# Patient Record
Sex: Male | Born: 1945
Health system: Southern US, Community
[De-identification: ages and names within clinical notes are randomized; demographics above are authoritative.]

## PROBLEM LIST (undated history)

## (undated) DIAGNOSIS — H269 Unspecified cataract: Secondary | ICD-10-CM

## (undated) DIAGNOSIS — G4730 Sleep apnea, unspecified: Secondary | ICD-10-CM

## (undated) DIAGNOSIS — I10 Essential (primary) hypertension: Secondary | ICD-10-CM

## (undated) HISTORY — PX: CATARACT EXTRACTION, BILATERAL: SHX1313

## (undated) HISTORY — DX: Essential (primary) hypertension: I10

## (undated) HISTORY — PX: SKIN CANCER EXCISION: SHX779

## (undated) HISTORY — PX: OTHER SURGICAL HISTORY: SHX169

## (undated) HISTORY — DX: Unspecified cataract: H26.9

## (undated) HISTORY — PX: CHOLECYSTECTOMY: SHX55

## (undated) HISTORY — DX: Sleep apnea, unspecified: G47.30

---

## 2002-01-01 ENCOUNTER — Encounter: Admission: RE | Admit: 2002-01-01 | Discharge: 2002-01-01 | Payer: Self-pay | Admitting: Gastroenterology

## 2002-01-01 ENCOUNTER — Encounter: Payer: Self-pay | Admitting: Gastroenterology

## 2003-04-03 ENCOUNTER — Encounter: Payer: Self-pay | Admitting: Pulmonary Disease

## 2003-04-09 ENCOUNTER — Encounter: Payer: Self-pay | Admitting: Pulmonary Disease

## 2003-04-09 ENCOUNTER — Ambulatory Visit (HOSPITAL_BASED_OUTPATIENT_CLINIC_OR_DEPARTMENT_OTHER): Admission: RE | Admit: 2003-04-09 | Discharge: 2003-04-09 | Payer: Self-pay | Admitting: Pulmonary Disease

## 2003-07-15 ENCOUNTER — Encounter: Payer: Self-pay | Admitting: Pulmonary Disease

## 2003-07-15 ENCOUNTER — Ambulatory Visit (HOSPITAL_BASED_OUTPATIENT_CLINIC_OR_DEPARTMENT_OTHER): Admission: RE | Admit: 2003-07-15 | Discharge: 2003-07-15 | Payer: Self-pay | Admitting: Pulmonary Disease

## 2005-04-12 ENCOUNTER — Ambulatory Visit: Payer: Self-pay | Admitting: Family Medicine

## 2005-04-16 ENCOUNTER — Ambulatory Visit: Payer: Self-pay | Admitting: Family Medicine

## 2005-05-28 ENCOUNTER — Ambulatory Visit: Payer: Self-pay | Admitting: Family Medicine

## 2005-07-15 ENCOUNTER — Ambulatory Visit: Payer: Self-pay | Admitting: Family Medicine

## 2006-07-21 ENCOUNTER — Ambulatory Visit: Payer: Self-pay | Admitting: Family Medicine

## 2006-07-22 LAB — CONVERTED CEMR LAB
ALT: 15 units/L (ref 0–40)
AST: 19 units/L (ref 0–37)
Albumin: 4.2 g/dL (ref 3.5–5.2)
Alkaline Phosphatase: 42 units/L (ref 39–117)
BUN: 13 mg/dL (ref 6–23)
Basophils Absolute: 0 10*3/uL (ref 0.0–0.1)
Basophils Relative: 0.6 % (ref 0.0–1.0)
Bilirubin, Direct: 0.1 mg/dL (ref 0.0–0.3)
CO2: 31 meq/L (ref 19–32)
Calcium: 9.5 mg/dL (ref 8.4–10.5)
Chloride: 103 meq/L (ref 96–112)
Cholesterol: 147 mg/dL (ref 0–200)
Creatinine, Ser: 0.9 mg/dL (ref 0.4–1.5)
Direct LDL: 60.8 mg/dL
Eosinophils Absolute: 0.1 10*3/uL (ref 0.0–0.6)
Eosinophils Relative: 2.1 % (ref 0.0–5.0)
GFR calc Af Amer: 111 mL/min
GFR calc non Af Amer: 92 mL/min
Glucose, Bld: 90 mg/dL (ref 70–99)
HCT: 40.3 % (ref 39.0–52.0)
HDL: 24.5 mg/dL — ABNORMAL LOW (ref >39.0)
Hemoglobin: 14.3 g/dL (ref 13.0–17.0)
Lymphocytes Relative: 26.3 % (ref 12.0–46.0)
MCHC: 35.4 g/dL (ref 30.0–36.0)
MCV: 85 fL (ref 78.0–100.0)
Monocytes Absolute: 0.5 10*3/uL (ref 0.2–0.7)
Monocytes Relative: 9.7 % (ref 3.0–11.0)
Neutro Abs: 3.5 10*3/uL (ref 1.4–7.7)
Neutrophils Relative %: 61.3 % (ref 43.0–77.0)
PSA: 1.08 ng/mL (ref 0.10–4.00)
Platelets: 216 10*3/uL (ref 150–400)
Potassium: 4 meq/L (ref 3.5–5.1)
RBC: 4.74 M/uL (ref 4.22–5.81)
RDW: 12 % (ref 11.5–14.6)
Sodium: 141 meq/L (ref 135–145)
TSH: 1.04 microintl units/mL (ref 0.35–5.50)
Total Bilirubin: 0.9 mg/dL (ref 0.3–1.2)
Total CHOL/HDL Ratio: 6
Total Protein: 7.1 g/dL (ref 6.0–8.3)
Triglycerides: 492 mg/dL (ref 0–149)
VLDL: 98 mg/dL — ABNORMAL HIGH (ref 0–40)
WBC: 5.5 10*3/uL (ref 4.5–10.5)

## 2006-09-15 ENCOUNTER — Telehealth: Payer: Self-pay | Admitting: Family Medicine

## 2006-10-25 DIAGNOSIS — I10 Essential (primary) hypertension: Secondary | ICD-10-CM

## 2007-01-23 ENCOUNTER — Encounter: Payer: Self-pay | Admitting: Pulmonary Disease

## 2007-02-16 ENCOUNTER — Ambulatory Visit: Payer: Self-pay | Admitting: Pulmonary Disease

## 2007-02-16 DIAGNOSIS — G4733 Obstructive sleep apnea (adult) (pediatric): Secondary | ICD-10-CM | POA: Insufficient documentation

## 2007-08-23 ENCOUNTER — Telehealth: Payer: Self-pay | Admitting: Family Medicine

## 2007-09-14 ENCOUNTER — Ambulatory Visit: Payer: Self-pay | Admitting: Gastroenterology

## 2007-09-18 ENCOUNTER — Telehealth (INDEPENDENT_AMBULATORY_CARE_PROVIDER_SITE_OTHER): Payer: Self-pay | Admitting: *Deleted

## 2007-09-18 ENCOUNTER — Telehealth: Payer: Self-pay | Admitting: Family Medicine

## 2007-10-10 ENCOUNTER — Encounter: Payer: Self-pay | Admitting: Gastroenterology

## 2007-10-10 ENCOUNTER — Ambulatory Visit: Payer: Self-pay | Admitting: Gastroenterology

## 2007-10-10 LAB — HM COLONOSCOPY

## 2007-10-13 ENCOUNTER — Encounter: Payer: Self-pay | Admitting: Gastroenterology

## 2008-02-15 ENCOUNTER — Ambulatory Visit: Payer: Self-pay | Admitting: Pulmonary Disease

## 2008-04-16 ENCOUNTER — Telehealth: Payer: Self-pay | Admitting: Family Medicine

## 2008-05-16 ENCOUNTER — Ambulatory Visit: Payer: Self-pay | Admitting: Family Medicine

## 2008-05-16 DIAGNOSIS — G5790 Unspecified mononeuropathy of unspecified lower limb: Secondary | ICD-10-CM | POA: Insufficient documentation

## 2008-05-16 LAB — CONVERTED CEMR LAB
ALT: 16 units/L (ref 0–53)
AST: 26 units/L (ref 0–37)
Albumin: 4.4 g/dL (ref 3.5–5.2)
Alkaline Phosphatase: 47 units/L (ref 39–117)
BUN: 16 mg/dL (ref 6–23)
Basophils Absolute: 0 10*3/uL (ref 0.0–0.1)
Basophils Relative: 0.1 % (ref 0.0–3.0)
Bilirubin Urine: NEGATIVE
Bilirubin, Direct: 0.2 mg/dL (ref 0.0–0.3)
Blood Glucose, Fingerstick: 101
Blood in Urine, dipstick: NEGATIVE
CO2: 32 meq/L (ref 19–32)
Calcium: 9.3 mg/dL (ref 8.4–10.5)
Chloride: 103 meq/L (ref 96–112)
Cholesterol: 153 mg/dL (ref 0–200)
Creatinine, Ser: 0.9 mg/dL (ref 0.4–1.5)
Eosinophils Absolute: 0.1 10*3/uL (ref 0.0–0.7)
Eosinophils Relative: 1.8 % (ref 0.0–5.0)
Folate: 10.1 ng/mL
GFR calc non Af Amer: 91.07 mL/min (ref >60)
Glucose, Bld: 93 mg/dL (ref 70–99)
Glucose, Urine, Semiquant: NEGATIVE
HCT: 40.7 % (ref 39.0–52.0)
HDL: 32.7 mg/dL — ABNORMAL LOW (ref >39.00)
Hemoglobin: 14.3 g/dL (ref 13.0–17.0)
Hgb A1c MFr Bld: 5.8 % (ref 4.6–6.5)
Iron: 80 ug/dL (ref 42–165)
Ketones, urine, test strip: NEGATIVE
LDL Cholesterol: 105 mg/dL — ABNORMAL HIGH (ref 0–99)
Lymphocytes Relative: 27.8 % (ref 12.0–46.0)
Lymphs Abs: 1.3 10*3/uL (ref 0.7–4.0)
MCHC: 35.1 g/dL (ref 30.0–36.0)
MCV: 86.9 fL (ref 78.0–100.0)
Monocytes Absolute: 0.4 10*3/uL (ref 0.1–1.0)
Monocytes Relative: 9.4 % (ref 3.0–12.0)
Neutro Abs: 2.7 10*3/uL (ref 1.4–7.7)
Neutrophils Relative %: 60.9 % (ref 43.0–77.0)
Nitrite: NEGATIVE
PSA: 1.66 ng/mL (ref 0.10–4.00)
Platelets: 192 10*3/uL (ref 150.0–400.0)
Potassium: 4.4 meq/L (ref 3.5–5.1)
Protein, U semiquant: NEGATIVE
RBC: 4.69 M/uL (ref 4.22–5.81)
RDW: 12 % (ref 11.5–14.6)
Saturation Ratios: 24 % (ref 20.0–50.0)
Sodium: 142 meq/L (ref 135–145)
Specific Gravity, Urine: 1.01
TSH: 0.97 microintl units/mL (ref 0.35–5.50)
Total Bilirubin: 1.4 mg/dL — ABNORMAL HIGH (ref 0.3–1.2)
Total CHOL/HDL Ratio: 5
Total Protein: 7.5 g/dL (ref 6.0–8.3)
Transferrin: 237.8 mg/dL (ref 212.0–360.0)
Triglycerides: 78 mg/dL (ref 0.0–149.0)
Urobilinogen, UA: 0.2
VLDL: 15.6 mg/dL (ref 0.0–40.0)
Vitamin B-12: 274 pg/mL (ref 211–911)
WBC Urine, dipstick: NEGATIVE
WBC: 4.5 10*3/uL (ref 4.5–10.5)
pH: 6.5

## 2008-06-13 ENCOUNTER — Ambulatory Visit: Payer: Self-pay | Admitting: Family Medicine

## 2009-02-13 ENCOUNTER — Ambulatory Visit: Payer: Self-pay | Admitting: Pulmonary Disease

## 2010-03-04 ENCOUNTER — Ambulatory Visit
Admission: RE | Admit: 2010-03-04 | Discharge: 2010-03-04 | Payer: Self-pay | Source: Home / Self Care | Attending: Pulmonary Disease | Admitting: Pulmonary Disease

## 2010-03-10 NOTE — Assessment & Plan Note (Signed)
Summary: rov for osa   CC:  Pt is here for a 1 yr f/u appt.  Pt states he is using his cpap machine every night.  Approx 8 hours per night.  Pt c/o cpap machine 'waking him up multiple times per day."  Pt denied any complaints with mask.  Pt wonders if pressure may need  to be decreased. Marland Kitchen  History of Present Illness: the pt comes in today for f/u of his osa.  He has been wearing cpap compliantly, and has no issues with pressure tolerance.  He is having mask fit issues at times, and thinks this is responsible for his frequent awakenings at times.  Overall, he feels he is sleeping reasonably well, and denies any significant daytimes sleepines..  Medications Prior to Update: 1)  Zestoretic 20-25 Mg  Tabs (Lisinopril-Hydrochlorothiazide) .... Take 1 Tablet By Mouth Once A Day 2)  Adult Aspirin Ec Low Strength 81 Mg Tbec (Aspirin) .... Once Daily 3)  Vicodin Es 7.5-750 Mg Tabs (Hydrocodone-Acetaminophen) .... Take 1 Tablet By Mouth Four Times A Day  Allergies (verified): 1)  ! Pcn  Review of Systems      See HPI  Vital Signs:  Patient profile:   65 year old male Height:      72 inches Weight:      193.50 pounds BMI:     26.34 O2 Sat:      95 % on Room air Temp:     98.2 degrees F oral Pulse rate:   81 / minute BP sitting:   122 / 76  (left arm) Cuff size:   regular  Vitals Entered By: Arman Filter LPN (February 13, 2062 11:32 AM)  O2 Flow:  Room air CC: Pt is here for a 1 yr f/u appt.  Pt states he is using his cpap machine every night.  Approx 8 hours per night.  Pt c/o cpap machine 'waking him up multiple times per day."  Pt denied any complaints with mask.  Pt wonders if pressure may need  to be decreased.  Comments Medications reviewed with patient Arman Filter LPN  February 13, 2062 11:32 AM    Physical Exam  General:  wd male in nad Nose:  no skin breakdown or pressure necrosis from cpap mask Neurologic:  alert and not sleepy, moves all 4.   Impression &  Recommendations:  Problem # 1:  OBSTRUCTIVE SLEEP APNEA (ICD-327.23) the pt is wearing cpap compliantly, but continues to be irritated by it.  He continues to wear because it is helping his sleep.  I talked with him about working on his mask fit, and suggested he visit the sleep center during the day to look at some of the newer masks.  I also discussed with him other treatment options for his osa, including surgery and dental appliance.  He will consider this, but will maintain on cpap for now.  Time spent discussing the above with pt was  Other Orders: Est. Patient Level III (56387)  Patient Instructions: 1)  no change in cpap for now.  Think about going to sleep center to look at some of the newer masks 2)  think about nasal surgery and dental appliance 3)  followup with me one year   Appended Document: rov for osa ?if pt will do better with auto device.  Consider this if mask changes do not help

## 2010-03-10 NOTE — Progress Notes (Signed)
Summary: Pulmonary [facility]   Imported By: Sherian Rein 02/14/2009 09:56:15  _____________________________________________________________________  External Attachment:    Type:   Image     Comment:   External Document

## 2010-03-18 NOTE — Assessment & Plan Note (Signed)
Summary: rov for osa,discusson of rx options   CC:  1 year f/u appt with OSA. States he wears his cpap machine every night.  Greater than 6 hours per night.  Pt dislikes wearing his machine. Marland Kitchen  History of Present Illness: the pt comes in today for f/u of his known osa.  He is wearing the device compliantly, and feels it is definitely helping his sleep.  However, it continues to be an aggrevation for him, despite the success it has had treating his symptoms.  He is also having nasal congestion that has been an ongoing issue.    Current Medications (verified): 1)  Zestoretic 20-25 Mg  Tabs (Lisinopril-Hydrochlorothiazide) .... 1/2 Tab By Mouth Once Daily 2)  Adult Aspirin Ec Low Strength 81 Mg Tbec (Aspirin) .... Once Daily  Allergies (verified): 1)  ! Pcn  Past History:  Past medical, surgical, family and social histories (including risk factors) reviewed, and no changes noted (except as noted below).  Past Medical History: Reviewed history from 01/23/2007 and no changes required. FAMILY HISTORY OF ASTHMA (ICD-V17.5) HYPERTENSION (ICD-401.9)  Past Surgical History: Reviewed history from 10/25/2006 and no changes required. Lumbar HNP Cholecystectomy Colonoscopy  Family History: Reviewed history from 10/25/2006 and no changes required. Family History of Asthma Family History of Cardiovascular disorder  Social History: Reviewed history from 10/25/2006 and no changes required. Occupation: Married Former Smoker Alcohol use-yes Drug use-no  Review of Systems       The patient complains of anxiety and depression.  The patient denies shortness of breath with activity, shortness of breath at rest, productive cough, non-productive cough, coughing up blood, chest pain, irregular heartbeats, acid heartburn, indigestion, loss of appetite, weight change, abdominal pain, difficulty swallowing, sore throat, tooth/dental problems, headaches, nasal congestion/difficulty breathing through  nose, sneezing, itching, ear ache, hand/feet swelling, joint stiffness or pain, rash, change in color of mucus, and fever.    Vital Signs:  Patient profile:   65 year old male Height:      72 inches Weight:      190.13 pounds BMI:     25.88 O2 Sat:      94 % on Room air Temp:     98.0 degrees F oral Pulse rate:   69 / minute BP sitting:   114 / 70  (left arm) Cuff size:   regular  Vitals Entered By: Arman Filter LPN (March 04, 2010 1:35 PM)  O2 Flow:  Room air CC: 1 year f/u appt with OSA. States he wears his cpap machine every night.  Greater than 6 hours per night.  Pt dislikes wearing his machine.  Comments Medications reviewed with patient. Aundra Millet Reynolds LPN  March 04, 2010 1:38 PM    Physical Exam  General:  wd male in nad Nose:  turbinate hypertrophy bilat, deviated septum to left with narrowing. Mouth:  mild to moderate elongation of soft palate and uvula Heart:  rrr Extremities:  no edema or cyanosis  Neurologic:  alert, oriented, moves all 4.   Impression & Recommendations:  Problem # 1:  OBSTRUCTIVE SLEEP APNEA (ICD-327.23)  the pt is wearing cpap compliantly with success, but only because it helps him.  He continues to be aggrevated by mask at times, and we have discussed the possibility of considering a dental appliance.  He is concerned about his nasal congestion, and tolerating something in his mouth.  We can try nasal ICS to see if his turbinates will shrink, but could also consider nasal surgery followed by  appliance if that was necessary.  Will go ahead and start on nasonex for the next few weeks to see if helps.  He is to call me if he wishes to consider dental appliance.    Medications Added to Medication List This Visit: 1)  Zestoretic 20-25 Mg Tabs (Lisinopril-hydrochlorothiazide) .... 1/2 tab by mouth once daily  Other Orders: Est. Patient Level IV (20254)  Patient Instructions: 1)  try nasonex 2 sprays each nostril each am for next 2 weeks to  see if helps nasal congestion 2)  please call if you would like to see local dentist for possible dental appliance 3)  get new mask with dme if decide you are going to stick with cpap 4)  followup with me in one year.

## 2010-05-12 ENCOUNTER — Encounter: Payer: Self-pay | Admitting: Family Medicine

## 2010-05-12 ENCOUNTER — Ambulatory Visit (INDEPENDENT_AMBULATORY_CARE_PROVIDER_SITE_OTHER): Payer: BC Managed Care – PPO | Admitting: Family Medicine

## 2010-05-12 VITALS — BP 140/86 | Temp 98.6°F | Ht 71.25 in | Wt 188.0 lb

## 2010-05-12 DIAGNOSIS — I10 Essential (primary) hypertension: Secondary | ICD-10-CM

## 2010-05-12 LAB — CBC WITH DIFFERENTIAL/PLATELET
Eosinophils Relative: 2 % (ref 0.0–5.0)
MCV: 87.2 fl (ref 78.0–100.0)
Monocytes Absolute: 0.4 10*3/uL (ref 0.1–1.0)
Neutrophils Relative %: 63.8 % (ref 43.0–77.0)
Platelets: 198 10*3/uL (ref 150.0–400.0)
WBC: 4.4 10*3/uL — ABNORMAL LOW (ref 4.5–10.5)

## 2010-05-12 LAB — POCT URINALYSIS DIPSTICK
Glucose, UA: NEGATIVE
Nitrite, UA: NEGATIVE
Protein, UA: NEGATIVE
Urobilinogen, UA: 0.2
pH, UA: 6.5

## 2010-05-12 MED ORDER — LISINOPRIL-HYDROCHLOROTHIAZIDE 20-25 MG PO TABS: 1.0000 | ORAL_TABLET | Freq: Every day | ORAL | Status: DC

## 2010-05-12 NOTE — Progress Notes (Signed)
  Subjective:    Patient ID: Randall Kirby, male    DOB: 30-Dec-1945, 65 y.o.   MRN: [id number]  HPIbeverly Is a 65 year old, married male, nonsmoker, who comes in today for an evaluation of hypertension.  He takes Zestoretic 20 -- 25 dose one half tab daily and a baby aspirin.  BP 140/86.  BPs at home mostly normal.  He gets routine eye care, dental care, recent colonoscopy, normal, tetanus, 2010, Pneumovax 2010, requesting information on shingles    Review of Systems  Constitutional: Negative.   HENT: Negative.   Eyes: Negative.   Respiratory: Negative.   Cardiovascular: Negative.   Gastrointestinal: Negative.   Genitourinary: Negative.   Musculoskeletal: Negative.   Skin: Negative.   Neurological: Negative.   Hematological: Negative.   Psychiatric/Behavioral: Negative.        Objective:   Physical Exam  Constitutional: He is oriented to person, place, and time. He appears well-developed and well-nourished.  HENT:  Head: Normocephalic and atraumatic.  Right Ear: External ear normal.  Left Ear: External ear normal.  Nose: Nose normal.  Mouth/Throat: Oropharynx is clear and moist.  Eyes: Conjunctivae and EOM are normal. Pupils are equal, round, and reactive to light.  Neck: Normal range of motion. Neck supple. No JVD present. No tracheal deviation present. No thyromegaly present.  Cardiovascular: Normal rate, regular rhythm, normal heart sounds and intact distal pulses.  Exam reveals no gallop and no friction rub.   No murmur heard. Pulmonary/Chest: Effort normal and breath sounds normal. No stridor. No respiratory distress. He has no wheezes. He has no rales. He exhibits no tenderness.  Abdominal: Soft. Bowel sounds are normal. He exhibits no distension and no mass. There is no tenderness. There is no rebound and no guarding.  Genitourinary: Rectum normal, prostate normal and penis normal. Guaiac negative stool. No penile tenderness.  Musculoskeletal: Normal range of  motion. He exhibits no edema and no tenderness.  Lymphadenopathy:    He has no cervical adenopathy.  Neurological: He is alert and oriented to person, place, and time. He has normal reflexes. No cranial nerve deficit. He exhibits normal muscle tone.  Skin: Skin is warm and dry. No rash noted. No erythema. No pallor.  Psychiatric: He has a normal mood and affect. His behavior is normal. Judgment and thought content normal.          Assessment & Plan:  Healthy male.  Hypertension.  Continue Zestoretic 20 -- 25 dose one half tab q.a.m. And aspirin tablet

## 2010-05-12 NOTE — Patient Instructions (Signed)
Continue your current medications.  Follow-up in one year, sooner if any problem 

## 2010-05-13 ENCOUNTER — Other Ambulatory Visit: Payer: Self-pay | Admitting: *Deleted

## 2010-05-13 DIAGNOSIS — I10 Essential (primary) hypertension: Secondary | ICD-10-CM

## 2010-05-13 LAB — LIPID PANEL
HDL: 34.8 mg/dL — ABNORMAL LOW (ref >39.00)
LDL Cholesterol: 110 mg/dL — ABNORMAL HIGH (ref 0–99)
Total CHOL/HDL Ratio: 5
Triglycerides: 111 mg/dL (ref 0.0–149.0)
VLDL: 22.2 mg/dL (ref 0.0–40.0)

## 2010-05-13 LAB — BASIC METABOLIC PANEL
BUN: 17 mg/dL (ref 6–23)
Creatinine, Ser: 1.1 mg/dL (ref 0.4–1.5)
GFR: 71.78 mL/min (ref >60.00)

## 2010-05-13 LAB — HEPATIC FUNCTION PANEL
ALT: 17 U/L (ref 0–53)
Bilirubin, Direct: 0.1 mg/dL (ref 0.0–0.3)
Total Bilirubin: 0.8 mg/dL (ref 0.3–1.2)

## 2010-05-13 MED ORDER — LISINOPRIL-HYDROCHLOROTHIAZIDE 20-25 MG PO TABS
ORAL_TABLET | ORAL | Status: DC
Start: 2010-05-13 — End: 2011-04-09

## 2010-05-13 MED ORDER — LISINOPRIL-HYDROCHLOROTHIAZIDE 20-12.5 MG PO TABS: ORAL_TABLET | ORAL | Status: DC

## 2010-05-13 NOTE — Telephone Encounter (Signed)
rx clarification

## 2010-06-18 ENCOUNTER — Telehealth: Payer: Self-pay | Admitting: *Deleted

## 2010-06-18 NOTE — Telephone Encounter (Signed)
Please call pt re his lab in April of this year.   He has copies of his labs, and has some questions.

## 2010-06-19 NOTE — Telephone Encounter (Signed)
Spoke with patient.

## 2011-04-09 ENCOUNTER — Encounter: Payer: Self-pay | Admitting: Pulmonary Disease

## 2011-04-09 ENCOUNTER — Ambulatory Visit (INDEPENDENT_AMBULATORY_CARE_PROVIDER_SITE_OTHER): Payer: BC Managed Care – PPO | Admitting: Pulmonary Disease

## 2011-04-09 VITALS — BP 130/78 | HR 69 | Temp 97.9°F | Ht 72.0 in | Wt 194.4 lb

## 2011-04-09 DIAGNOSIS — G4733 Obstructive sleep apnea (adult) (pediatric): Secondary | ICD-10-CM

## 2011-04-09 NOTE — Assessment & Plan Note (Signed)
The patient has known moderate obstructive sleep apnea for which he has been wearing CPAP, but has really been struggling with the device from the very beginning.  Currently he has a mass that is not fitting well, and he is due for a new CPAP machine.  He would like to consider other options, and I discussed with him a dental appliance.  He also has been thinking about not treating his sleep apnea, and I have told him he would have to accept his increased cardiovascular risk if he chose to do so.  He will go home and consider his various options, but in the meantime I will give him a better fitting mask.

## 2011-04-09 NOTE — Progress Notes (Signed)
  Subjective:    Patient ID: Randall Kirby, male    DOB: [birth year], 66 y.o.   MRN: [id number]  HPI The patient comes in today for followup of his known moderate obstructive sleep apnea.  He has been wearing CPAP off and on, and continues to have issues with mask fit and pressure.  He tells me that he stopped using CPAP for a few months last summer, and really did not see a difference in how he felt.  He continues to struggle with the device, but currently he has a lot of mask leaks.  He is asking me about a dental appliance for treatment, and we have discussed at great length.   Review of Systems  Constitutional: Negative for fever and unexpected weight change.  HENT: Negative for ear pain, nosebleeds, congestion, sore throat, rhinorrhea, sneezing, trouble swallowing, dental problem, postnasal drip and sinus pressure.   Eyes: Negative for redness and itching.  Respiratory: Negative for cough, chest tightness, shortness of breath and wheezing.   Cardiovascular: Negative for palpitations and leg swelling.  Gastrointestinal: Negative for nausea and vomiting.  Genitourinary: Negative for dysuria.  Musculoskeletal: Positive for back pain. Negative for joint swelling.  Skin: Negative for rash.  Neurological: Negative for headaches.  Hematological: Does not bruise/bleed easily.  Psychiatric/Behavioral: Negative for dysphoric mood. The patient is not nervous/anxious.        Objective:   Physical Exam Well-developed male in no acute distress No skin breakdown or pressure necrosis from the CPAP mask Chest clear to auscultation Heart exam with regular rate and rhythm Lower extremities without edema, no cyanosis Alert and oriented, moves all 4 extremities.       Assessment & Plan:

## 2011-04-09 NOTE — Patient Instructions (Signed)
Consider options of not treating your sleep apnea vs trying dental appliance vs getting new cpap machine and mask and sticking with it Please call me next week with your decision.

## 2011-04-15 ENCOUNTER — Ambulatory Visit: Payer: BC Managed Care – PPO | Admitting: Pulmonary Disease

## 2011-06-18 ENCOUNTER — Other Ambulatory Visit: Payer: Self-pay | Admitting: Family Medicine

## 2011-12-16 ENCOUNTER — Other Ambulatory Visit (INDEPENDENT_AMBULATORY_CARE_PROVIDER_SITE_OTHER): Payer: BC Managed Care – PPO

## 2011-12-16 DIAGNOSIS — T887XXA Unspecified adverse effect of drug or medicament, initial encounter: Secondary | ICD-10-CM

## 2011-12-16 DIAGNOSIS — Z0000 Encounter for general adult medical examination without abnormal findings: Secondary | ICD-10-CM

## 2011-12-16 DIAGNOSIS — I10 Essential (primary) hypertension: Secondary | ICD-10-CM

## 2011-12-16 DIAGNOSIS — Z125 Encounter for screening for malignant neoplasm of prostate: Secondary | ICD-10-CM

## 2011-12-16 LAB — LIPID PANEL
HDL: 31.7 mg/dL — ABNORMAL LOW (ref >39.00)
Total CHOL/HDL Ratio: 5

## 2011-12-16 LAB — CBC WITH DIFFERENTIAL/PLATELET
Eosinophils Relative: 2.3 % (ref 0.0–5.0)
HCT: 42.7 % (ref 39.0–52.0)
Hemoglobin: 14.2 g/dL (ref 13.0–17.0)
Lymphs Abs: 1.5 10*3/uL (ref 0.7–4.0)
MCV: 87.1 fl (ref 78.0–100.0)
Monocytes Absolute: 0.4 10*3/uL (ref 0.1–1.0)
Neutro Abs: 2.9 10*3/uL (ref 1.4–7.7)
Platelets: 200 10*3/uL (ref 150.0–400.0)
RDW: 12.9 % (ref 11.5–14.6)
WBC: 5 10*3/uL (ref 4.5–10.5)

## 2011-12-16 LAB — POCT URINALYSIS DIPSTICK
Bilirubin, UA: NEGATIVE
Glucose, UA: NEGATIVE
Ketones, UA: NEGATIVE
Leukocytes, UA: NEGATIVE
Nitrite, UA: NEGATIVE
pH, UA: 7

## 2011-12-16 LAB — HEPATIC FUNCTION PANEL
Alkaline Phosphatase: 42 U/L (ref 39–117)
Bilirubin, Direct: 0.2 mg/dL (ref 0.0–0.3)
Total Bilirubin: 1 mg/dL (ref 0.3–1.2)

## 2011-12-16 LAB — BASIC METABOLIC PANEL
Calcium: 9 mg/dL (ref 8.4–10.5)
Creatinine, Ser: 1 mg/dL (ref 0.4–1.5)
GFR: 83.57 mL/min (ref >60.00)
Glucose, Bld: 92 mg/dL (ref 70–99)
Sodium: 136 mEq/L (ref 135–145)

## 2011-12-16 LAB — PSA: PSA: 1.1 ng/mL (ref 0.10–4.00)

## 2011-12-23 ENCOUNTER — Ambulatory Visit (INDEPENDENT_AMBULATORY_CARE_PROVIDER_SITE_OTHER): Payer: BC Managed Care – PPO | Admitting: Family Medicine

## 2011-12-23 ENCOUNTER — Encounter: Payer: Self-pay | Admitting: Family Medicine

## 2011-12-23 DIAGNOSIS — I10 Essential (primary) hypertension: Secondary | ICD-10-CM

## 2011-12-23 DIAGNOSIS — G4733 Obstructive sleep apnea (adult) (pediatric): Secondary | ICD-10-CM

## 2011-12-23 MED ORDER — LISINOPRIL-HYDROCHLOROTHIAZIDE 20-25 MG PO TABS: ORAL_TABLET | ORAL | Status: DC

## 2011-12-23 NOTE — Patient Instructions (Signed)
Continue to take the blood pressure medication one half tab daily  Return sometime in the next couple weeks for removal of the lesion on your nose  If you have a question about a [place]vette call my neighbor his name is [employer] at [phone]

## 2011-12-23 NOTE — Progress Notes (Signed)
  Subjective:    Patient ID: Randall Kirby, male    DOB: 1945/08/12, 66 y.o.   MRN: [id number]  HPI Randall Kirby is a 66 year old married male nonsmoker who comes in today for general physical examination because of a history of hypertension  He takes Zestoretic 20-12.5 daily for hypotension her BP normal at home 130/70.  He gets routine eye care, dental care, colonoscopy normal, tetanus 2010, Pneumovax 2010, seasonal flu shot at the pharmacy   Review of Systems  Constitutional: Negative.   HENT: Negative.   Eyes: Negative.   Respiratory: Negative.   Cardiovascular: Negative.   Gastrointestinal: Negative.   Genitourinary: Negative.   Musculoskeletal: Negative.   Skin: Negative.   Neurological: Negative.   Hematological: Negative.   Psychiatric/Behavioral: Negative.        Objective:   Physical Exam  Constitutional: He is oriented to person, place, and time. He appears well-developed and well-nourished.  HENT:  Head: Normocephalic and atraumatic.  Right Ear: External ear normal.  Left Ear: External ear normal.  Nose: Nose normal.  Mouth/Throat: Oropharynx is clear and moist.  Eyes: Conjunctivae normal and EOM are normal. Pupils are equal, round, and reactive to light.  Neck: Normal range of motion. Neck supple. No JVD present. No tracheal deviation present. No thyromegaly present.  Cardiovascular: Normal rate, regular rhythm, normal heart sounds and intact distal pulses.  Exam reveals no gallop and no friction rub.   No murmur heard. Pulmonary/Chest: Effort normal and breath sounds normal. No stridor. No respiratory distress. He has no wheezes. He has no rales. He exhibits no tenderness.  Abdominal: Soft. Bowel sounds are normal. He exhibits no distension and no mass. There is no tenderness. There is no rebound and no guarding.  Genitourinary: Rectum normal, prostate normal and penis normal. Guaiac negative stool. No penile tenderness.  Musculoskeletal: Normal range of motion.  He exhibits no edema and no tenderness.  Lymphadenopathy:    He has no cervical adenopathy.  Neurological: He is alert and oriented to person, place, and time. He has normal reflexes. No cranial nerve deficit. He exhibits normal muscle tone.  Skin: Skin is warm and dry. No rash noted. No erythema. No pallor.  Psychiatric: He has a normal mood and affect. His behavior is normal. Judgment and thought content normal.   total body skin exam normal except for a red lesion on his nose that's been nonhealing and bleeding        Assessment & Plan:  Healthy male  Hypertension continue current medication  Abnormal lesion on his nose return for removal

## 2011-12-30 ENCOUNTER — Ambulatory Visit (INDEPENDENT_AMBULATORY_CARE_PROVIDER_SITE_OTHER): Payer: BC Managed Care – PPO | Admitting: Family Medicine

## 2011-12-30 ENCOUNTER — Encounter: Payer: Self-pay | Admitting: Family Medicine

## 2011-12-30 DIAGNOSIS — C44319 Basal cell carcinoma of skin of other parts of face: Secondary | ICD-10-CM

## 2011-12-30 DIAGNOSIS — C44311 Basal cell carcinoma of skin of nose: Secondary | ICD-10-CM

## 2011-12-30 NOTE — Progress Notes (Signed)
  Subjective:    Patient ID: Randall Kirby, male    DOB: 01/04/1946, 66 y.o.   MRN: [id number]  HPI Randall Kirby is a 66 year old male who comes in today for removal of a lesion on the left side of the tip of his nose.  We saw him a week ago for general physical examination at that time he mentioned he had a lesion on the left side of his nose that have been there for couple months and it recently been bleeding excessively with minor trauma.  On physical examination it appears to be a 6 mm x6 mm red lesion with a central depression consistent with a basal cell carcinoma  He was taken to the treatment and after informed consent the lesion was anesthetized with 1% Xylocaine no epinephrine. The lesion was removed with 2 mm margins. It bled excessively however we were able to cauterize the wound and stop the bleeding. The lesion was sent for pathologic analysis. He tolerated the procedure well no complications   Review of Systems    general and dermatologic review of systems otherwise negative Objective:   Physical Exam Procedure see above       Assessment & Plan:  Clinically it is a basal cell carcinoma path pending

## 2011-12-30 NOTE — Patient Instructions (Signed)
Within 2 weeks we will call you the report. If we do not call you in 2 weeks call as

## 2012-02-08 DIAGNOSIS — Z23 Encounter for immunization: Secondary | ICD-10-CM | POA: Diagnosis not present

## 2012-04-03 ENCOUNTER — Ambulatory Visit (INDEPENDENT_AMBULATORY_CARE_PROVIDER_SITE_OTHER): Payer: Medicare Other | Admitting: Family Medicine

## 2012-04-03 ENCOUNTER — Encounter: Payer: Self-pay | Admitting: Family Medicine

## 2012-04-03 VITALS — BP 110/80 | Temp 98.4°F | Wt 194.0 lb

## 2012-04-03 DIAGNOSIS — G5790 Unspecified mononeuropathy of unspecified lower limb: Secondary | ICD-10-CM

## 2012-04-03 DIAGNOSIS — C44311 Basal cell carcinoma of skin of nose: Secondary | ICD-10-CM

## 2012-04-03 DIAGNOSIS — C44319 Basal cell carcinoma of skin of other parts of face: Secondary | ICD-10-CM

## 2012-04-03 DIAGNOSIS — N401 Enlarged prostate with lower urinary tract symptoms: Secondary | ICD-10-CM

## 2012-04-03 DIAGNOSIS — G4733 Obstructive sleep apnea (adult) (pediatric): Secondary | ICD-10-CM

## 2012-04-03 DIAGNOSIS — I10 Essential (primary) hypertension: Secondary | ICD-10-CM

## 2012-04-03 NOTE — Progress Notes (Signed)
  Subjective:    Patient ID: Randall Kirby, male    DOB: January 05, 1946, 67 y.o.   MRN: [id number]  HPI Zach is a 67 year old married male nonsmoker who comes in today for followup of a BCE on  his nose  In November we did his physical examination and noticed an abnormal lesion the left side of his nose. He came back within a week we excised it was a basal cell with clean margins. He's in today for followup to assess recurrence. He says it looks fine to him he sees no real pigmentation   Review of Systems Dermatologic review of systems otherwise negative    Objective:   Physical Exam  Well-developed well nourished male no acute distress examination nose shows a healing lesion no repigment patient left side of the nose      Assessment & Plan:   basal cell carcinomaleft nose healed well no evidence of recurrent return in November

## 2012-04-03 NOTE — Patient Instructions (Signed)
Continue your sunscreens daily  Return in November for your annual exam sooner if any problems

## 2012-05-18 ENCOUNTER — Encounter: Payer: Self-pay | Admitting: Pulmonary Disease

## 2012-05-18 ENCOUNTER — Ambulatory Visit (INDEPENDENT_AMBULATORY_CARE_PROVIDER_SITE_OTHER): Payer: Medicare Other | Admitting: Pulmonary Disease

## 2012-05-18 VITALS — BP 108/62 | HR 64 | Temp 98.1°F | Ht 72.0 in | Wt 192.6 lb

## 2012-05-18 DIAGNOSIS — G4733 Obstructive sleep apnea (adult) (pediatric): Secondary | ICD-10-CM | POA: Diagnosis not present

## 2012-05-18 NOTE — Assessment & Plan Note (Signed)
The patient is trying to be as compliant as possible with his CPAP, but is having ongoing issues with mask leaking which is significantly disrupting his sleep.  He is also having issues with his machine is over 66 years old, and is due for replacement.  He would like to get on the automatic setting to see if he tolerates this better.  I will also arrange for a fitting at the sleep Center so that we can improve the leaking issue which seems to be a major sleep disrupter for him.

## 2012-05-18 NOTE — Patient Instructions (Addendum)
Will arrange for a mask fitting at the sleep center.  Work on full face, but do not rule out the possibility of a nasal mask with chin strap Will get you a new cpap machine and keep on auto followup with me in one year, but call if you are not doing well with the cpap.

## 2012-05-18 NOTE — Progress Notes (Signed)
  Subjective:    Patient ID: Randall Kirby, male    DOB: August 16, 1945, 67 y.o.   MRN: [id number]  HPI The patient comes in for followup of his obstructive sleep apnea.  He has been trying to wear CPAP compliantly, but continues to have issues with mask leaking.  His lower face is partially recessed, and therefore has difficulties with full face mask.  However, he also has a lot of nasal congestion issues which makes using a nasal mask more difficult.  His mask leaking is causing frequent awakenings at night, and interrupting his sleep.  He also has a CPAP machine is over 47 years old, and is starting to give him problems.  He is overdue for a new device.   Review of Systems  Constitutional: Negative for fever and unexpected weight change.  HENT: Positive for congestion. Negative for ear pain, nosebleeds, sore throat, rhinorrhea, sneezing, trouble swallowing, dental problem, postnasal drip and sinus pressure.   Eyes: Negative for redness and itching.  Respiratory: Negative for cough, chest tightness, shortness of breath and wheezing.   Cardiovascular: Negative for palpitations and leg swelling.  Gastrointestinal: Negative for nausea and vomiting.  Genitourinary: Negative for dysuria.  Musculoskeletal: Negative for joint swelling.  Skin: Negative for rash.  Neurological: Negative for headaches.  Hematological: Does not bruise/bleed easily.  Psychiatric/Behavioral: Negative for dysphoric mood. The patient is not nervous/anxious.        Objective:   Physical Exam Well-developed male in no acute distress Nose with purulent discharge noted No skin breakdown or pressure necrosis from the CPAP mask Neck without lymphadenopathy or thyromegaly Chest clear to auscultation Cardiac exam regular rate and rhythm Lower extremities without edema, no cyanosis Alert and oriented, moves all 4 extremities.       Assessment & Plan:

## 2012-05-22 ENCOUNTER — Ambulatory Visit (HOSPITAL_BASED_OUTPATIENT_CLINIC_OR_DEPARTMENT_OTHER): Payer: Medicare Other | Attending: Pulmonary Disease | Admitting: Radiology

## 2012-05-22 DIAGNOSIS — G4733 Obstructive sleep apnea (adult) (pediatric): Secondary | ICD-10-CM

## 2012-05-22 DIAGNOSIS — Z9989 Dependence on other enabling machines and devices: Secondary | ICD-10-CM

## 2012-09-13 ENCOUNTER — Encounter: Payer: Self-pay | Admitting: Gastroenterology

## 2012-09-19 ENCOUNTER — Ambulatory Visit: Payer: Medicare Other | Admitting: Family Medicine

## 2012-11-06 ENCOUNTER — Ambulatory Visit: Payer: Medicare Other | Admitting: Family Medicine

## 2012-11-16 ENCOUNTER — Ambulatory Visit: Payer: Medicare Other | Admitting: Family Medicine

## 2012-12-19 ENCOUNTER — Telehealth: Payer: Self-pay | Admitting: Family Medicine

## 2012-12-19 ENCOUNTER — Other Ambulatory Visit (INDEPENDENT_AMBULATORY_CARE_PROVIDER_SITE_OTHER): Payer: Medicare Other

## 2012-12-19 DIAGNOSIS — N401 Enlarged prostate with lower urinary tract symptoms: Secondary | ICD-10-CM | POA: Diagnosis not present

## 2012-12-19 DIAGNOSIS — I10 Essential (primary) hypertension: Secondary | ICD-10-CM | POA: Diagnosis not present

## 2012-12-19 LAB — PSA: PSA: 1.04 ng/mL (ref 0.10–4.00)

## 2012-12-19 LAB — POCT URINALYSIS DIPSTICK
Glucose, UA: NEGATIVE
Leukocytes, UA: NEGATIVE
Nitrite, UA: NEGATIVE
Urobilinogen, UA: 0.2
pH, UA: 6.5

## 2012-12-19 LAB — HEPATIC FUNCTION PANEL
ALT: 13 U/L (ref 0–53)
AST: 18 U/L (ref 0–37)
Albumin: 4.2 g/dL (ref 3.5–5.2)
Alkaline Phosphatase: 45 U/L (ref 39–117)
Total Protein: 6.9 g/dL (ref 6.0–8.3)

## 2012-12-19 LAB — BASIC METABOLIC PANEL
BUN: 23 mg/dL (ref 6–23)
Calcium: 9 mg/dL (ref 8.4–10.5)
GFR: 85.36 mL/min (ref >60.00)
Glucose, Bld: 97 mg/dL (ref 70–99)
Sodium: 136 mEq/L (ref 135–145)

## 2012-12-19 LAB — CBC WITH DIFFERENTIAL/PLATELET
Basophils Absolute: 0 10*3/uL (ref 0.0–0.1)
Eosinophils Absolute: 0.1 10*3/uL (ref 0.0–0.7)
Hemoglobin: 14.2 g/dL (ref 13.0–17.0)
Lymphocytes Relative: 27.3 % (ref 12.0–46.0)
MCHC: 33.8 g/dL (ref 30.0–36.0)
Monocytes Relative: 10.6 % (ref 3.0–12.0)
Neutro Abs: 3.4 10*3/uL (ref 1.4–7.7)
Platelets: 199 10*3/uL (ref 150.0–400.0)
RDW: 13.2 % (ref 11.5–14.6)

## 2012-12-19 LAB — LIPID PANEL
Cholesterol: 148 mg/dL (ref 0–200)
HDL: 32 mg/dL — ABNORMAL LOW (ref >39.00)
Total CHOL/HDL Ratio: 5
Triglycerides: 147 mg/dL (ref 0.0–149.0)

## 2012-12-19 MED ORDER — ZOSTER VACCINE LIVE 19400 UNT/0.65ML ~~LOC~~ SOLR: 0.6500 mL | Freq: Once | SUBCUTANEOUS | Status: DC

## 2012-12-19 NOTE — Telephone Encounter (Signed)
Pt would like a shingles rx called into [facility] at [address] [place], [place] This is a new pharm for pt. Pt is going to switch to this pharm from now on.

## 2012-12-19 NOTE — Telephone Encounter (Signed)
Rx sent and new pharmacy noted in chart

## 2012-12-22 ENCOUNTER — Other Ambulatory Visit: Payer: Medicare Other

## 2012-12-26 ENCOUNTER — Ambulatory Visit (INDEPENDENT_AMBULATORY_CARE_PROVIDER_SITE_OTHER): Payer: Medicare Other | Admitting: Family Medicine

## 2012-12-26 ENCOUNTER — Encounter: Payer: Self-pay | Admitting: Family Medicine

## 2012-12-26 VITALS — BP 130/90 | Temp 98.0°F | Ht 71.5 in | Wt 195.0 lb

## 2012-12-26 DIAGNOSIS — I10 Essential (primary) hypertension: Secondary | ICD-10-CM

## 2012-12-26 DIAGNOSIS — C44319 Basal cell carcinoma of skin of other parts of face: Secondary | ICD-10-CM | POA: Diagnosis not present

## 2012-12-26 DIAGNOSIS — Z0000 Encounter for general adult medical examination without abnormal findings: Secondary | ICD-10-CM

## 2012-12-26 DIAGNOSIS — C44311 Basal cell carcinoma of skin of nose: Secondary | ICD-10-CM

## 2012-12-26 MED ORDER — LISINOPRIL-HYDROCHLOROTHIAZIDE 20-12.5 MG PO TABS: ORAL_TABLET | ORAL | Status: DC

## 2012-12-26 NOTE — Patient Instructions (Signed)
Continue good health habits  Take the aspirin and Zestoretic one of each daily if you're currently doing  We will get you set up to see a specialist for evaluation of the lesion on nose  Return in one year sooner if any problems

## 2012-12-26 NOTE — Progress Notes (Signed)
  Subjective:    Patient ID: LEXX MONTE, male    DOB: February 02, 1946, 67 y.o.   MRN: [id number]  HPI Mr. Salahuddin is a delightful 67 year old married male nonsmoker who comes in today for his first Medicare wellness examination  He's had a history of mild hypertension on Zestoretic 20-12.5 BP 130/90 he also takes an aspirin tablet daily  6 he gets routine eye care, dental care, colonoscopy and GI, vaccinations up-to-date flu shot today  Cognitive function normal he exercises on a regular basis home health safety reviewed no issues identified, no guns in the house, he does have a health care power of attorney and living well   Review of Systems  Constitutional: Negative.   HENT: Negative.   Eyes: Negative.   Respiratory: Negative.   Cardiovascular: Negative.   Gastrointestinal: Negative.   Endocrine: Negative.   Genitourinary: Negative.   Musculoskeletal: Negative.   Skin: Negative.   Allergic/Immunologic: Negative.   Neurological: Negative.   Hematological: Negative.   Psychiatric/Behavioral: Negative.        Objective:   Physical Exam  Nursing note and vitals reviewed. Constitutional: He is oriented to person, place, and time. He appears well-developed and well-nourished.  HENT:  Head: Normocephalic and atraumatic.  Right Ear: External ear normal.  Left Ear: External ear normal.  Nose: Nose normal.  Mouth/Throat: Oropharynx is clear and moist.  Eyes: Conjunctivae and EOM are normal. Pupils are equal, round, and reactive to light.  Neck: Normal range of motion. Neck supple. No JVD present. No tracheal deviation present. No thyromegaly present.  Cardiovascular: Normal rate, regular rhythm, normal heart sounds and intact distal pulses.  Exam reveals no gallop and no friction rub.   No murmur heard. No carotid or aortic bruits peripheral pulses 2+ and symmetrical  Pulmonary/Chest: Effort normal and breath sounds normal. No stridor. No respiratory distress. He has no  wheezes. He has no rales. He exhibits no tenderness.  Abdominal: Soft. Bowel sounds are normal. He exhibits no distension and no mass. There is no tenderness. There is no rebound and no guarding.  Genitourinary: Rectum normal, prostate normal and penis normal. Guaiac negative stool. No penile tenderness.  Musculoskeletal: Normal range of motion. He exhibits no edema and no tenderness.  Lymphadenopathy:    He has no cervical adenopathy.  Neurological: He is alert and oriented to person, place, and time. He has normal reflexes. No cranial nerve deficit. He exhibits normal muscle tone.  Skin: Skin is warm and dry. No rash noted. No erythema. No pallor.  Total body skin exam normal the lesion we've previously excised on his nose appears to be recurring. Send to dermatology for Mohs surgery  Psychiatric: He has a normal mood and affect. His behavior is normal. Judgment and thought content normal.          Assessment & Plan:  Healthy male  Hypertension at goal continue Zestoretic and aspirin  Basal cell carcinoma of the nose looks like it's recurring,,,,,,,, dermatology consult

## 2013-01-30 DIAGNOSIS — Z85828 Personal history of other malignant neoplasm of skin: Secondary | ICD-10-CM | POA: Diagnosis not present

## 2013-01-30 DIAGNOSIS — C44319 Basal cell carcinoma of skin of other parts of face: Secondary | ICD-10-CM | POA: Diagnosis not present

## 2013-02-17 DIAGNOSIS — Z23 Encounter for immunization: Secondary | ICD-10-CM | POA: Diagnosis not present

## 2013-03-14 DIAGNOSIS — H259 Unspecified age-related cataract: Secondary | ICD-10-CM | POA: Diagnosis not present

## 2013-03-14 DIAGNOSIS — H5200 Hypermetropia, unspecified eye: Secondary | ICD-10-CM | POA: Diagnosis not present

## 2013-03-14 DIAGNOSIS — H524 Presbyopia: Secondary | ICD-10-CM | POA: Diagnosis not present

## 2013-03-28 ENCOUNTER — Encounter: Payer: Self-pay | Admitting: Gastroenterology

## 2013-04-16 DIAGNOSIS — D239 Other benign neoplasm of skin, unspecified: Secondary | ICD-10-CM | POA: Diagnosis not present

## 2013-04-16 DIAGNOSIS — L819 Disorder of pigmentation, unspecified: Secondary | ICD-10-CM | POA: Diagnosis not present

## 2013-04-16 DIAGNOSIS — D1801 Hemangioma of skin and subcutaneous tissue: Secondary | ICD-10-CM | POA: Diagnosis not present

## 2013-04-16 DIAGNOSIS — L821 Other seborrheic keratosis: Secondary | ICD-10-CM | POA: Diagnosis not present

## 2013-04-16 DIAGNOSIS — L219 Seborrheic dermatitis, unspecified: Secondary | ICD-10-CM | POA: Diagnosis not present

## 2013-04-16 DIAGNOSIS — Z85828 Personal history of other malignant neoplasm of skin: Secondary | ICD-10-CM | POA: Diagnosis not present

## 2013-05-21 ENCOUNTER — Encounter: Payer: Self-pay | Admitting: Pulmonary Disease

## 2013-05-21 ENCOUNTER — Ambulatory Visit (INDEPENDENT_AMBULATORY_CARE_PROVIDER_SITE_OTHER): Payer: Medicare Other | Admitting: Pulmonary Disease

## 2013-05-21 ENCOUNTER — Ambulatory Visit (AMBULATORY_SURGERY_CENTER): Payer: Self-pay | Admitting: *Deleted

## 2013-05-21 VITALS — BP 110/70 | HR 61 | Temp 98.0°F | Ht 72.0 in | Wt 197.2 lb

## 2013-05-21 VITALS — Ht 72.0 in | Wt 196.4 lb

## 2013-05-21 DIAGNOSIS — Z86010 Personal history of colonic polyps: Secondary | ICD-10-CM

## 2013-05-21 DIAGNOSIS — G4733 Obstructive sleep apnea (adult) (pediatric): Secondary | ICD-10-CM | POA: Diagnosis not present

## 2013-05-21 MED ORDER — PREPOPIK 10-3.5-12 MG-GM-GM PO PACK: PACK | ORAL | Status: DC

## 2013-05-21 NOTE — Assessment & Plan Note (Signed)
The patient is doing well with his current CPAP setup, and I have asked him to continue with his compliance. I've also asked him to keep up with his mask changes and supplies, and to work aggressively on weight loss. He will followup with me again in one year.

## 2013-05-21 NOTE — Patient Instructions (Signed)
Continue with cpap, and keep up with mask changes and supplies. Work on weight loss followup with me in one year.  

## 2013-05-21 NOTE — Progress Notes (Signed)
   Subjective:    Patient ID: Randall Kirby, male    DOB: [birth year], 68 y.o.   MRN: [id number]  HPI The patient comes in today for followup of his obstructive sleep apnea. He is wearing CPAP compliantly, and his current a new device is working extremely well on the automatic setting. He is having no issues with mask fit or pressure, and feels that he sleeps well with the device.   Review of Systems  Constitutional: Negative for fever and unexpected weight change.  HENT: Negative for congestion, dental problem, ear pain, nosebleeds, postnasal drip, rhinorrhea, sinus pressure, sneezing, sore throat and trouble swallowing.   Eyes: Negative for redness and itching.  Respiratory: Negative for cough, chest tightness, shortness of breath and wheezing.   Cardiovascular: Negative for palpitations and leg swelling.  Gastrointestinal: Negative for nausea and vomiting.  Genitourinary: Negative for dysuria.  Musculoskeletal: Negative for joint swelling.  Skin: Negative for rash.  Neurological: Negative for headaches.  Hematological: Does not bruise/bleed easily.  Psychiatric/Behavioral: Negative for dysphoric mood. The patient is not nervous/anxious.        Objective:   Physical Exam Well-developed male in no acute distress Nose without purulence or discharge noted No skin breakdown or pressure necrosis from the CPAP mask Neck without lymphadenopathy or thyromegaly Lower extremities without edema, no cyanosis Alert and oriented, moves all 4 extremities.       Assessment & Plan:

## 2013-05-21 NOTE — Progress Notes (Signed)
Patient denies any allergies to eggs or soy. Patient denies any problems with anesthesia. Patient denies any oxygen use. Uses CPAP.

## 2013-05-22 ENCOUNTER — Telehealth: Payer: Self-pay | Admitting: Gastroenterology

## 2013-05-22 NOTE — Telephone Encounter (Signed)
Pt wanted OTC alternative for prep.  Talked through [facility] prep via telephone.

## 2013-05-25 NOTE — Telephone Encounter (Signed)
Copy of Miralax prep faxed to pt per wife's instructions to home phone.

## 2013-05-31 ENCOUNTER — Encounter: Payer: Self-pay | Admitting: Gastroenterology

## 2013-05-31 ENCOUNTER — Ambulatory Visit (AMBULATORY_SURGERY_CENTER): Payer: Medicare Other | Admitting: Gastroenterology

## 2013-05-31 VITALS — BP 105/64 | HR 49 | Temp 98.0°F | Resp 14 | Ht 72.0 in | Wt 196.0 lb

## 2013-05-31 DIAGNOSIS — I10 Essential (primary) hypertension: Secondary | ICD-10-CM | POA: Diagnosis not present

## 2013-05-31 DIAGNOSIS — G4733 Obstructive sleep apnea (adult) (pediatric): Secondary | ICD-10-CM | POA: Diagnosis not present

## 2013-05-31 DIAGNOSIS — Z86010 Personal history of colonic polyps: Secondary | ICD-10-CM | POA: Diagnosis not present

## 2013-05-31 DIAGNOSIS — Z1211 Encounter for screening for malignant neoplasm of colon: Secondary | ICD-10-CM | POA: Diagnosis not present

## 2013-05-31 HISTORY — PX: COLONOSCOPY: SHX174

## 2013-05-31 MED ORDER — SODIUM CHLORIDE 0.9 % IV SOLN: 500.0000 mL | INTRAVENOUS | Status: DC

## 2013-05-31 NOTE — Progress Notes (Signed)
Patient denies any allergies to eggs or soy. Patient denies any problems with anesthesia. No oxygen use at home per patient. Uses CPAP. No diet/wt loss pills per patient.

## 2013-05-31 NOTE — Progress Notes (Signed)
Report to pacu rn, vss, bbs=clear 

## 2013-05-31 NOTE — Op Note (Signed)
[place]  [address] [place], [id number]   COLONOSCOPY PROCEDURE REPORT  PATIENT: Randall Kirby, [place]  MR#: [id number] BIRTHDATE: 1945-08-05 , 67  yrs. old GENDER: Male ENDOSCOPIST: Ladene Artist, MD, [facility] PROCEDURE DATE:  05/31/2013 PROCEDURE:   Colonoscopy, surveillance First Screening Colonoscopy - Avg.  risk and is 50 yrs.  old or older - No.  Prior Negative Screening - Now for repeat screening. N/A  History of Adenoma - Now for follow-up colonoscopy & has been > or = to 3 yrs.  Yes hx of adenoma.  Has been 3 or more years since last colonoscopy.  Polyps Removed Today? No.  Recommend repeat exam, <10 yrs? Yes.  High risk (family or personal hx). ASA CLASS:   Class II INDICATIONS:Patient's personal history of adenomatous colon polyps.  MEDICATIONS: MAC sedation, administered by CRNA and propofol (Diprivan) 250mg  IV DESCRIPTION OF PROCEDURE:   After the risks benefits and alternatives of the procedure were thoroughly explained, informed consent was obtained.  A digital rectal exam revealed no abnormalities of the rectum.   The [facility] [id number]  endoscope was introduced through the anus and advanced to the cecum, which was identified by both the appendix and ileocecal valve. No adverse events experienced.   The quality of the prep was good, using MoviPrep  The instrument was then slowly withdrawn as the colon was fully examined.  COLON FINDINGS: Mild diverticulosis was noted in the sigmoid colon. The colon was otherwise normal.  There was no diverticulosis, inflammation, polyps or cancers unless previously stated. Retroflexed views revealed small internal hemorrhoids. The time to cecum=3 minutes 10 seconds.  Withdrawal time=10 minutes 02 seconds. The scope was withdrawn and the procedure completed.  COMPLICATIONS: There were no complications.  ENDOSCOPIC IMPRESSION: 1.   Mild diverticulosis in the sigmoid colon 2.   Small internal  hemorrhoids  RECOMMENDATIONS: 1.  High fiber diet with liberal fluid intake. 2.  Repeat Colonoscopy in 5 years.  eSigned:  Ladene Artist, MD, [facility] 05/31/2013 10:32 AM

## 2013-05-31 NOTE — Patient Instructions (Signed)
YOU HAD AN ENDOSCOPIC PROCEDURE TODAY AT [facility]: Refer to the procedure report that was given to you for any specific questions about what was found during the examination.  If the procedure report does not answer your questions, please call your gastroenterologist to clarify.  If you requested that your care partner not be given the details of your procedure findings, then the procedure report has been included in a sealed envelope for you to review at your convenience later.  YOU SHOULD EXPECT: Some feelings of bloating in the abdomen. Passage of more gas than usual.  Walking can help get rid of the air that was put into your GI tract during the procedure and reduce the bloating. If you had a lower endoscopy (such as a colonoscopy or flexible sigmoidoscopy) you may notice spotting of blood in your stool or on the toilet paper. If you underwent a bowel prep for your procedure, then you may not have a normal bowel movement for a few days.  DIET: Your first meal following the procedure should be a light meal and then it is ok to progress to your normal diet.  A half-sandwich or bowl of soup is an example of a good first meal.  Heavy or fried foods are harder to digest and may make you feel nauseous or bloated.  Likewise meals heavy in dairy and vegetables can cause extra gas to form and this can also increase the bloating.  Drink plenty of fluids but you should avoid alcoholic beverages for 24 hours.  ACTIVITY: Your care partner should take you home directly after the procedure.  You should plan to take it easy, moving slowly for the rest of the day.  You can resume normal activity the day after the procedure however you should NOT DRIVE or use heavy machinery for 24 hours (because of the sedation medicines used during the test).    SYMPTOMS TO REPORT IMMEDIATELY: A gastroenterologist can be reached at any hour.  During normal business hours, 8:30 AM to 5:00 PM Monday through Friday,  call [phone].  After hours and on weekends, please call the GI answering service at [phone] who will take a message and have the physician on call contact you.   Following lower endoscopy (colonoscopy or flexible sigmoidoscopy):  Excessive amounts of blood in the stool  Significant tenderness or worsening of abdominal pains  Swelling of the abdomen that is new, acute  Fever of 100F or higher  FOLLOW UP: Our staff will call the home number listed on your records the next business day following your procedure to check on you and address any questions or concerns that you may have at that time regarding the information given to you following your procedure. This is a courtesy call and so if there is no answer at the home number and we have not heard from you through the emergency physician on call, we will assume that you have returned to your regular daily activities without incident.  SIGNATURES/CONFIDENTIALITY: You and/or your care partner have signed paperwork which will be entered into your electronic medical record.  These signatures attest to the fact that that the information above on your After Visit Summary has been reviewed and is understood.  Full responsibility of the confidentiality of this discharge information lies with you and/or your care-partner.  Continue your normal medications  Next colonoscopy in 5 years  Please read handouts about diverticulosis, hemorrhoids, and high fiber diets  Liberal fluids

## 2013-06-01 ENCOUNTER — Telehealth: Payer: Self-pay | Admitting: *Deleted

## 2013-06-01 NOTE — Telephone Encounter (Signed)
  Follow up Call-  Call back number 05/31/2013  Post procedure Call Back phone  # [phone]  Permission to leave phone message Yes     Patient questions:  Do you have a fever, pain , or abdominal swelling? no Pain Score  0 *  Have you tolerated food without any problems? yes  Have you been able to return to your normal activities? yes  Do you have any questions about your discharge instructions: Diet   no Medications  no Follow up visit  no  Do you have questions or concerns about your Care? no  Actions: * If pain score is 4 or above: No action needed, pain <4.

## 2014-04-09 ENCOUNTER — Other Ambulatory Visit (INDEPENDENT_AMBULATORY_CARE_PROVIDER_SITE_OTHER): Payer: Medicare Other

## 2014-04-09 DIAGNOSIS — D649 Anemia, unspecified: Secondary | ICD-10-CM | POA: Diagnosis not present

## 2014-04-09 DIAGNOSIS — R300 Dysuria: Secondary | ICD-10-CM | POA: Diagnosis not present

## 2014-04-09 DIAGNOSIS — E785 Hyperlipidemia, unspecified: Secondary | ICD-10-CM

## 2014-04-09 DIAGNOSIS — E039 Hypothyroidism, unspecified: Secondary | ICD-10-CM | POA: Diagnosis not present

## 2014-04-09 DIAGNOSIS — I10 Essential (primary) hypertension: Secondary | ICD-10-CM

## 2014-04-09 DIAGNOSIS — N400 Enlarged prostate without lower urinary tract symptoms: Secondary | ICD-10-CM

## 2014-04-09 LAB — HEPATIC FUNCTION PANEL
ALBUMIN: 4.3 g/dL (ref 3.5–5.2)
ALT: 14 U/L (ref 0–53)
AST: 19 U/L (ref 0–37)
Alkaline Phosphatase: 49 U/L (ref 39–117)
Bilirubin, Direct: 0.2 mg/dL (ref 0.0–0.3)
Total Bilirubin: 1 mg/dL (ref 0.2–1.2)
Total Protein: 6.9 g/dL (ref 6.0–8.3)

## 2014-04-09 LAB — LIPID PANEL
CHOL/HDL RATIO: 4
Cholesterol: 137 mg/dL (ref 0–200)
HDL: 30.5 mg/dL — ABNORMAL LOW (ref >39.00)
LDL Cholesterol: 71 mg/dL (ref 0–99)
NONHDL: 106.5
Triglycerides: 180 mg/dL — ABNORMAL HIGH (ref 0.0–149.0)
VLDL: 36 mg/dL (ref 0.0–40.0)

## 2014-04-09 LAB — PSA: PSA: 1.3 ng/mL (ref 0.10–4.00)

## 2014-04-09 LAB — BASIC METABOLIC PANEL
BUN: 21 mg/dL (ref 6–23)
CALCIUM: 8.9 mg/dL (ref 8.4–10.5)
CO2: 30 mEq/L (ref 19–32)
Chloride: 103 mEq/L (ref 96–112)
Creatinine, Ser: 0.95 mg/dL (ref 0.40–1.50)
GFR: 83.99 mL/min (ref >60.00)
Glucose, Bld: 99 mg/dL (ref 70–99)
Potassium: 4.2 mEq/L (ref 3.5–5.1)
SODIUM: 137 meq/L (ref 135–145)

## 2014-04-09 LAB — TSH: TSH: 1.36 u[IU]/mL (ref 0.35–4.50)

## 2014-04-09 LAB — POCT URINALYSIS DIPSTICK
Bilirubin, UA: NEGATIVE
Glucose, UA: NEGATIVE
KETONES UA: NEGATIVE
Leukocytes, UA: NEGATIVE
Nitrite, UA: NEGATIVE
Protein, UA: NEGATIVE
SPEC GRAV UA: 1.015
Urobilinogen, UA: 0.2
pH, UA: 6

## 2014-04-09 LAB — CBC WITH DIFFERENTIAL/PLATELET
BASOS ABS: 0 10*3/uL (ref 0.0–0.1)
Basophils Relative: 0.7 % (ref 0.0–3.0)
Eosinophils Absolute: 0.1 10*3/uL (ref 0.0–0.7)
Eosinophils Relative: 2.8 % (ref 0.0–5.0)
HCT: 41.6 % (ref 39.0–52.0)
HEMOGLOBIN: 14.4 g/dL (ref 13.0–17.0)
LYMPHS ABS: 1.6 10*3/uL (ref 0.7–4.0)
LYMPHS PCT: 30.1 % (ref 12.0–46.0)
MCHC: 34.5 g/dL (ref 30.0–36.0)
MCV: 84.1 fl (ref 78.0–100.0)
Monocytes Absolute: 0.7 10*3/uL (ref 0.1–1.0)
Monocytes Relative: 12.5 % — ABNORMAL HIGH (ref 3.0–12.0)
NEUTROS PCT: 53.9 % (ref 43.0–77.0)
Neutro Abs: 2.9 10*3/uL (ref 1.4–7.7)
Platelets: 196 10*3/uL (ref 150.0–400.0)
RBC: 4.95 Mil/uL (ref 4.22–5.81)
RDW: 12.8 % (ref 11.5–15.5)
WBC: 5.3 10*3/uL (ref 4.0–10.5)

## 2014-04-16 ENCOUNTER — Ambulatory Visit (INDEPENDENT_AMBULATORY_CARE_PROVIDER_SITE_OTHER): Payer: Medicare Other | Admitting: Family Medicine

## 2014-04-16 ENCOUNTER — Encounter: Payer: Self-pay | Admitting: Family Medicine

## 2014-04-16 VITALS — BP 130/80 | Temp 97.8°F | Ht 71.5 in | Wt 198.0 lb

## 2014-04-16 DIAGNOSIS — I10 Essential (primary) hypertension: Secondary | ICD-10-CM

## 2014-04-16 DIAGNOSIS — N401 Enlarged prostate with lower urinary tract symptoms: Secondary | ICD-10-CM

## 2014-04-16 DIAGNOSIS — L814 Other melanin hyperpigmentation: Secondary | ICD-10-CM | POA: Diagnosis not present

## 2014-04-16 DIAGNOSIS — D1801 Hemangioma of skin and subcutaneous tissue: Secondary | ICD-10-CM | POA: Diagnosis not present

## 2014-04-16 DIAGNOSIS — Z23 Encounter for immunization: Secondary | ICD-10-CM | POA: Diagnosis not present

## 2014-04-16 DIAGNOSIS — D2272 Melanocytic nevi of left lower limb, including hip: Secondary | ICD-10-CM | POA: Diagnosis not present

## 2014-04-16 DIAGNOSIS — L570 Actinic keratosis: Secondary | ICD-10-CM | POA: Diagnosis not present

## 2014-04-16 DIAGNOSIS — D225 Melanocytic nevi of trunk: Secondary | ICD-10-CM | POA: Diagnosis not present

## 2014-04-16 DIAGNOSIS — R351 Nocturia: Secondary | ICD-10-CM

## 2014-04-16 DIAGNOSIS — Z85828 Personal history of other malignant neoplasm of skin: Secondary | ICD-10-CM | POA: Diagnosis not present

## 2014-04-16 DIAGNOSIS — L821 Other seborrheic keratosis: Secondary | ICD-10-CM | POA: Diagnosis not present

## 2014-04-16 MED ORDER — LISINOPRIL-HYDROCHLOROTHIAZIDE 20-12.5 MG PO TABS: ORAL_TABLET | ORAL | Status: DC

## 2014-04-16 NOTE — Patient Instructions (Signed)
Continue blood pressure medicine....... one half tab daily  If you can tolerate it....... take one aspirin daily  Continue exercise program  Living will  [employer]......Marland Kitchen will take over for me after May  Good luck in the future..... Your good guy and I  have enjoyed seeing you all these years.

## 2014-04-16 NOTE — Progress Notes (Signed)
Pre visit review using our clinic review tool, if applicable. No additional management support is needed unless otherwise documented below in the visit note. 

## 2014-04-16 NOTE — Progress Notes (Signed)
   Subjective:    Patient ID: Randall Kirby, male    DOB: 06-13-1945, 69 y.o.   MRN: [id number]  HPI Randall Kirby is a 69 year old married male nonsmoker who comes in today for general physical examination because of a history of hypertension  He takes Zestoretic 20-12 0.5 tab dose one half tab daily BP 130/80  He gets routine eye care, dental care, colonoscopy 2015 normal  Vaccinations updated he was given the Pneumovax 13 today. Had a shingles shot last year. All his other vaccinations are up-to-date  Cognitive function normal he walks on a regular basis home health safety reviewed no issues identified, no guns in the house, he does have a healthcare power of attorney and living well  He does have some history of BPH with some nocturia 1 or 2 otherwise asymptomatic   Review of Systems  Constitutional: Negative.   HENT: Negative.   Eyes: Negative.   Respiratory: Negative.   Cardiovascular: Negative.   Gastrointestinal: Negative.   Endocrine: Negative.   Genitourinary: Negative.   Musculoskeletal: Negative.   Skin: Negative.   Allergic/Immunologic: Negative.   Neurological: Negative.   Hematological: Negative.   Psychiatric/Behavioral: Negative.        Objective:   Physical Exam  Constitutional: He is oriented to person, place, and time. He appears well-developed and well-nourished.  HENT:  Head: Normocephalic and atraumatic.  Right Ear: External ear normal.  Left Ear: External ear normal.  Nose: Nose normal.  Mouth/Throat: Oropharynx is clear and moist.  Eyes: Conjunctivae and EOM are normal. Pupils are equal, round, and reactive to light.  Neck: Normal range of motion. Neck supple. No JVD present. No tracheal deviation present. No thyromegaly present.  Cardiovascular: Normal rate, regular rhythm, normal heart sounds and intact distal pulses.  Exam reveals no gallop and no friction rub.   No murmur heard. Pulmonary/Chest: Effort normal and breath sounds normal. No  stridor. No respiratory distress. He has no wheezes. He has no rales. He exhibits no tenderness.  Abdominal: Soft. Bowel sounds are normal. He exhibits no distension and no mass. There is no tenderness. There is no rebound and no guarding.  Genitourinary: Rectum normal and penis normal. Guaiac negative stool. No penile tenderness.  1+ symmetrical nonnodular BPH  Musculoskeletal: Normal range of motion. He exhibits no edema or tenderness.  Lymphadenopathy:    He has no cervical adenopathy.  Neurological: He is alert and oriented to person, place, and time. He has normal reflexes. No cranial nerve deficit. He exhibits normal muscle tone.  Skin: Skin is warm and dry. No rash noted. No erythema. No pallor.  Total body skin exam normal he saw his dermatologist Randall Kirby today. Had a skin cancer taken off the tip of his nose a couple years ago  Psychiatric: He has a normal mood and affect. His behavior is normal. Judgment and thought content normal.  Nursing note and vitals reviewed.         Assessment & Plan:  Healthy male  Hypertension ago continue current therapy  History of skin cancer followed by dermatology Randall Kirby

## 2014-04-17 ENCOUNTER — Telehealth: Payer: Self-pay | Admitting: Family Medicine

## 2014-04-17 NOTE — Telephone Encounter (Signed)
emmi mailed  °

## 2014-07-18 ENCOUNTER — Ambulatory Visit (INDEPENDENT_AMBULATORY_CARE_PROVIDER_SITE_OTHER): Payer: Medicare Other | Admitting: Pulmonary Disease

## 2014-07-18 ENCOUNTER — Encounter: Payer: Self-pay | Admitting: Pulmonary Disease

## 2014-07-18 VITALS — BP 134/82 | HR 63 | Temp 97.2°F | Ht 72.0 in | Wt 194.0 lb

## 2014-07-18 DIAGNOSIS — G4733 Obstructive sleep apnea (adult) (pediatric): Secondary | ICD-10-CM | POA: Diagnosis not present

## 2014-07-18 NOTE — Patient Instructions (Signed)
Continue on cpap, and keep up with mask changes and supplies. Try the [facility] mask sample, and let us know if this does not work for you. followup with Dr. Halford Chessman in one year.

## 2014-07-18 NOTE — Progress Notes (Signed)
   Subjective:    Patient ID: Randall Kirby, male    DOB: 1945-07-27, 69 y.o.   MRN: [id number]  HPI The patient comes in today for follow-up of his obstructive sleep apnea. He has been wearing his device compliantly, and feels that it greatly helps his sleep.  However, recently he has been having issues with air blowing into his eyes and drying them out severely. He does not have mouth dryness as long as he uses his heated humidifier. He is unsure if his full face mask is leaking up into his eyes at the bridge of his nose.   Review of Systems  Constitutional: Negative for fever and unexpected weight change.  HENT: Negative for congestion, dental problem, ear pain, nosebleeds, postnasal drip, rhinorrhea, sinus pressure, sneezing, sore throat and trouble swallowing.   Eyes: Positive for redness. Negative for itching.  Respiratory: Negative for cough, chest tightness, shortness of breath and wheezing.   Cardiovascular: Negative for palpitations and leg swelling.  Gastrointestinal: Negative for nausea and vomiting.  Genitourinary: Negative for dysuria.  Musculoskeletal: Negative for joint swelling.  Skin: Negative for rash.  Neurological: Negative for headaches.  Hematological: Does not bruise/bleed easily.  Psychiatric/Behavioral: Negative for dysphoric mood. The patient is not nervous/anxious.        Objective:   Physical Exam Well-developed male in no acute distress Nose without purulence or discharge noted Neck without lymphadenopathy or thyromegaly Eyes are mildly puffy and red No skin breakdown or pressure necrosis from the CPAP mask Lower extremities without edema, no cyanosis Alert and oriented, moves all 4 extremities.       Assessment & Plan:

## 2014-07-18 NOTE — Assessment & Plan Note (Signed)
The patient tells me that he is wearing C Pap compliantly, but is having issues with dry eyes. It is unclear if he is having small leaks from his full face mask, or if he is getting air through his tear duct and into his eyes. I would like for him to try a different type of mask, and have given him a sample of the [facility]  He will let us know if this does not work out.

## 2015-02-11 ENCOUNTER — Telehealth: Payer: Self-pay | Admitting: Family Medicine

## 2015-02-11 ENCOUNTER — Other Ambulatory Visit: Payer: Self-pay | Admitting: Family Medicine

## 2015-02-11 DIAGNOSIS — R351 Nocturia: Secondary | ICD-10-CM

## 2015-02-11 DIAGNOSIS — N401 Enlarged prostate with lower urinary tract symptoms: Secondary | ICD-10-CM

## 2015-02-11 DIAGNOSIS — I10 Essential (primary) hypertension: Secondary | ICD-10-CM

## 2015-02-11 NOTE — Telephone Encounter (Signed)
Pt's wife called to sched CPE for pt. Wife and pt were adamant about scheduling CPE labs. Both were advised that we don't book CPE labs in advance for Medicare patients. Pt's wife stated that [facility] does labs in advanced as has Dr. Sherren Mocha. Randall Kirby advised that a message be put in to Dr. Sherren Mocha to see if he will place lab orders prior to CPE, but pt and wife were advised that insurance may not cover the labs and any remaining balance would be the responsibility of the patient.

## 2015-02-11 NOTE — Telephone Encounter (Signed)
Labs ordered and patient is aware

## 2015-03-24 DIAGNOSIS — H1789 Other corneal scars and opacities: Secondary | ICD-10-CM | POA: Diagnosis not present

## 2015-03-24 DIAGNOSIS — H40053 Ocular hypertension, bilateral: Secondary | ICD-10-CM | POA: Diagnosis not present

## 2015-03-24 DIAGNOSIS — H25813 Combined forms of age-related cataract, bilateral: Secondary | ICD-10-CM | POA: Diagnosis not present

## 2015-03-24 DIAGNOSIS — H5212 Myopia, left eye: Secondary | ICD-10-CM | POA: Diagnosis not present

## 2015-04-15 ENCOUNTER — Emergency Department (HOSPITAL_COMMUNITY)
Admission: EM | Admit: 2015-04-15 | Discharge: 2015-04-15 | Disposition: A | Discharge status: Home / Self Care | Payer: Medicare Other | Attending: Emergency Medicine | Admitting: Emergency Medicine

## 2015-04-15 ENCOUNTER — Encounter (HOSPITAL_COMMUNITY): Payer: Self-pay

## 2015-04-15 DIAGNOSIS — Y998 Other external cause status: Secondary | ICD-10-CM | POA: Diagnosis not present

## 2015-04-15 DIAGNOSIS — Z880 Allergy status to penicillin: Secondary | ICD-10-CM | POA: Diagnosis not present

## 2015-04-15 DIAGNOSIS — Y9289 Other specified places as the place of occurrence of the external cause: Secondary | ICD-10-CM | POA: Diagnosis not present

## 2015-04-15 DIAGNOSIS — S51851A Open bite of right forearm, initial encounter: Secondary | ICD-10-CM | POA: Diagnosis not present

## 2015-04-15 DIAGNOSIS — S61511A Laceration without foreign body of right wrist, initial encounter: Secondary | ICD-10-CM | POA: Diagnosis not present

## 2015-04-15 DIAGNOSIS — Z8669 Personal history of other diseases of the nervous system and sense organs: Secondary | ICD-10-CM | POA: Diagnosis not present

## 2015-04-15 DIAGNOSIS — Z7982 Long term (current) use of aspirin: Secondary | ICD-10-CM | POA: Insufficient documentation

## 2015-04-15 DIAGNOSIS — S61431A Puncture wound without foreign body of right hand, initial encounter: Secondary | ICD-10-CM | POA: Insufficient documentation

## 2015-04-15 DIAGNOSIS — S61552A Open bite of left wrist, initial encounter: Secondary | ICD-10-CM | POA: Diagnosis not present

## 2015-04-15 DIAGNOSIS — Z87891 Personal history of nicotine dependence: Secondary | ICD-10-CM | POA: Insufficient documentation

## 2015-04-15 DIAGNOSIS — S60812A Abrasion of left wrist, initial encounter: Secondary | ICD-10-CM | POA: Diagnosis not present

## 2015-04-15 DIAGNOSIS — S51852A Open bite of left forearm, initial encounter: Secondary | ICD-10-CM | POA: Insufficient documentation

## 2015-04-15 DIAGNOSIS — S61532A Puncture wound without foreign body of left wrist, initial encounter: Secondary | ICD-10-CM | POA: Insufficient documentation

## 2015-04-15 DIAGNOSIS — Z23 Encounter for immunization: Secondary | ICD-10-CM | POA: Diagnosis not present

## 2015-04-15 DIAGNOSIS — Y9389 Activity, other specified: Secondary | ICD-10-CM | POA: Insufficient documentation

## 2015-04-15 DIAGNOSIS — S50811A Abrasion of right forearm, initial encounter: Secondary | ICD-10-CM | POA: Insufficient documentation

## 2015-04-15 DIAGNOSIS — S59912A Unspecified injury of left forearm, initial encounter: Secondary | ICD-10-CM | POA: Diagnosis present

## 2015-04-15 DIAGNOSIS — I10 Essential (primary) hypertension: Secondary | ICD-10-CM | POA: Diagnosis not present

## 2015-04-15 DIAGNOSIS — W540XXA Bitten by dog, initial encounter: Secondary | ICD-10-CM | POA: Diagnosis not present

## 2015-04-15 DIAGNOSIS — S41159A Open bite of unspecified upper arm, initial encounter: Secondary | ICD-10-CM

## 2015-04-15 MED ORDER — BACITRACIN ZINC 500 UNIT/GM EX OINT
1.0000 "application " | TOPICAL_OINTMENT | Freq: Two times a day (BID) | CUTANEOUS | Status: DC
  Administered 2015-04-15: 1 via TOPICAL
  Filled 2015-04-15: qty 0.9

## 2015-04-15 MED ORDER — TETANUS-DIPHTH-ACELL PERTUSSIS 5-2.5-18.5 LF-MCG/0.5 IM SUSP
0.5000 mL | Freq: Once | INTRAMUSCULAR | Status: AC
  Administered 2015-04-15: 0.5 mL via INTRAMUSCULAR
  Filled 2015-04-15: qty 0.5

## 2015-04-15 MED ORDER — DOXYCYCLINE HYCLATE 100 MG PO CAPS: 100.0000 mg | ORAL_CAPSULE | Freq: Two times a day (BID) | ORAL | Status: DC

## 2015-04-15 MED ORDER — METRONIDAZOLE 500 MG PO TABS: 500.0000 mg | ORAL_TABLET | Freq: Three times a day (TID) | ORAL | Status: DC

## 2015-04-15 MED ORDER — METRONIDAZOLE 500 MG PO TABS
500.0000 mg | ORAL_TABLET | Freq: Three times a day (TID) | ORAL | Status: AC
  Administered 2015-04-15: 500 mg via ORAL
  Filled 2015-04-15: qty 1

## 2015-04-15 MED ORDER — IBUPROFEN 600 MG PO TABS: 600.0000 mg | ORAL_TABLET | Freq: Four times a day (QID) | ORAL | Status: DC | PRN

## 2015-04-15 MED ORDER — DOXYCYCLINE HYCLATE 100 MG PO TABS
100.0000 mg | ORAL_TABLET | Freq: Once | ORAL | Status: AC
  Administered 2015-04-15: 100 mg via ORAL
  Filled 2015-04-15: qty 1

## 2015-04-15 NOTE — Discharge Instructions (Signed)

## 2015-04-15 NOTE — ED Provider Notes (Signed)
CSN: [id number]     Arrival date & time 04/15/15  2037 History  By signing my name below, I, Randall Kirby, attest that this documentation has been prepared under the direction and in the presence of [employer], PA-C. Electronically Signed: Nicole Kirby, ED Scribe 04/15/2015 at 9:42 PM.    Chief Complaint  Patient presents with  . Animal Bite    The history is provided by the patient. No language interpreter was used.   HPI Comments: Randall Kirby is a 70 y.o. male who presents to the Emergency Department complaining of sudden onset, laceration onset this afternoon after being bitten by his dog. Pt reports pain and bleeding to his left and right forearms and wrist areas. His last tetanus shot was 7 years ago. The dog had received a rabies shot. No worsening or alleviating factors noted. Pt denies any other pertinent symptoms.   Past Medical History  Diagnosis Date  . Hypertension   . Sleep apnea    Past Surgical History  Procedure Laterality Date  . Lumbar hnp    . Cholecystectomy    . Skin cancer excision      nose   Family History  Problem Relation Age of Onset  . Asthma Other   . Heart disease Other   . Colon cancer Neg Hx    Social History  Substance Use Topics  . Smoking status: Former Smoker -- 1.00 packs/day for 3 years    Types: Cigarettes    Quit date: 02/08/1966  . Smokeless tobacco: Never Used  . Alcohol Use: Yes     Comment: ocassional beer/rare 1-2 beers per year per patient/Rm    Review of Systems  Musculoskeletal: Positive for myalgias.       Pain to right and left forearm and wrist.   Skin: Positive for wound.  All other systems reviewed and are negative. A complete 10 system review of systems was obtained and all systems are negative except as noted in the HPI and PMH.    Allergies  Penicillins  Home Medications   Prior to Admission medications   Medication Sig Start Date End Date Taking? Authorizing Provider  aspirin 81 MG tablet  Take 81 mg by mouth daily.     Yes Historical Provider, MD  lisinopril-hydrochlorothiazide (PRINZIDE,ZESTORETIC) 20-12.5 MG per tablet Half tab daily Patient taking differently: Take 0.5 tablets by mouth daily. Half tab daily 04/16/14  Yes Dorena Cookey, MD  doxycycline (VIBRAMYCIN) 100 MG capsule Take 1 capsule (100 mg total) by mouth 2 (two) times daily. 04/15/15   Antonietta Breach, PA-C  ibuprofen (ADVIL,MOTRIN) 600 MG tablet Take 1 tablet (600 mg total) by mouth every 6 (six) hours as needed for mild pain or moderate pain. 04/15/15   Antonietta Breach, PA-C  metroNIDAZOLE (FLAGYL) 500 MG tablet Take 1 tablet (500 mg total) by mouth 3 (three) times daily. 04/15/15   Antonietta Breach, PA-C   BP 140/82 mmHg  Pulse 93  Temp(Src) 98.1 F (36.7 C) (Oral)  Resp 22  Ht 6' (1.829 m)  Wt 190 lb (86.183 kg)  BMI 25.76 kg/m2  SpO2 95%  Physical Exam  Constitutional: He is oriented to person, place, and time. He appears well-developed and well-nourished. No distress.  Nontoxic/nonseptic appearing  HENT:  Head: Normocephalic and atraumatic.  Eyes: Conjunctivae and EOM are normal. No scleral icterus.  Neck: Normal range of motion.  Cardiovascular: Normal rate, regular rhythm and intact distal pulses.   Distal radial pulse 2+ b/l  Pulmonary/Chest:  Effort normal. No respiratory distress.  Respirations even and unlabored  Musculoskeletal: Normal range of motion.       Right wrist: He exhibits laceration. He exhibits normal range of motion, no swelling, no effusion and no crepitus.       Left wrist: He exhibits normal range of motion, no swelling, no crepitus and no deformity.       Arms:      Right hand: Tenderness: mild at puncture wounds.  Puncture wound and abrasion noted to volar aspect of left wrist. Superificial lacerations noted to dorsal aspect of distal right forearm.   Neurological: He is alert and oriented to person, place, and time. He exhibits normal muscle tone. Coordination normal.  Sensation to light  touch intact. Patient able to wiggle all fingers.  Skin: Skin is warm and dry. No rash noted. He is not diaphoretic. No erythema. No pallor.  Capillary refill brisk in all digits b/l  Psychiatric: He has a normal mood and affect. His behavior is normal.  Nursing note and vitals reviewed.   ED Course  Procedures (including critical care time) DIAGNOSTIC STUDIES: Oxygen Saturation is 95% on RA, normal by my interpretation.    COORDINATION OF CARE: 9:42 PM-Discussed treatment plan which includes tdap, bacitran ointment, and wound care with pt at bedside and pt agreed to plan.   Labs Review Labs Reviewed - No data to display  Imaging Review No results found.   EKG Interpretation None      MDM   Final diagnoses:  Dog bite of arm, unspecified laterality, initial encounter    Patient with lacerations and abrasion sustained from a dog this evening. Dog UTD on immunizations and rabies, per patient. He is neurovascularly intact. Compartments soft. No evidence of secondary infection or abscess; no purulent drainage. Patient afebrile. Tetanus updated as last administered 7 years ago. Will start on Doxycycline and Flagyl given Amoxicillin allergy. Patient referred to Dr. Burney Gauze for follow up and wound recheck PRN. Return precautions discussed and provided. Patient discharged in good condition with no unaddressed concerns.  I personally performed the services described in this documentation, which was scribed in my presence. The recorded information has been reviewed and is accurate.    Filed Vitals:   04/15/15 2108  BP: 140/82  Pulse: 93  Temp: 98.1 F (36.7 C)  TempSrc: Oral  Resp: 22  Height: 6' (1.829 m)  Weight: 86.183 kg  SpO2: 95%         Antonietta Breach, PA-C 04/15/15 2209  Quintella Reichert, MD 04/18/15 1022

## 2015-04-15 NOTE — ED Notes (Signed)
Pt and his wife rescued a poodle today and after they brought it home it bit him on his forearm and hand

## 2015-04-16 DIAGNOSIS — L814 Other melanin hyperpigmentation: Secondary | ICD-10-CM | POA: Diagnosis not present

## 2015-04-16 DIAGNOSIS — L821 Other seborrheic keratosis: Secondary | ICD-10-CM | POA: Diagnosis not present

## 2015-04-16 DIAGNOSIS — D225 Melanocytic nevi of trunk: Secondary | ICD-10-CM | POA: Diagnosis not present

## 2015-04-16 DIAGNOSIS — D1801 Hemangioma of skin and subcutaneous tissue: Secondary | ICD-10-CM | POA: Diagnosis not present

## 2015-04-16 DIAGNOSIS — D2272 Melanocytic nevi of left lower limb, including hip: Secondary | ICD-10-CM | POA: Diagnosis not present

## 2015-04-16 DIAGNOSIS — Z85828 Personal history of other malignant neoplasm of skin: Secondary | ICD-10-CM | POA: Diagnosis not present

## 2015-04-17 DIAGNOSIS — S61552A Open bite of left wrist, initial encounter: Secondary | ICD-10-CM | POA: Diagnosis not present

## 2015-04-17 DIAGNOSIS — S51851A Open bite of right forearm, initial encounter: Secondary | ICD-10-CM | POA: Diagnosis not present

## 2015-04-17 DIAGNOSIS — S51852A Open bite of left forearm, initial encounter: Secondary | ICD-10-CM | POA: Diagnosis not present

## 2015-04-17 DIAGNOSIS — S61551A Open bite of right wrist, initial encounter: Secondary | ICD-10-CM | POA: Diagnosis not present

## 2015-04-17 DIAGNOSIS — W540XXA Bitten by dog, initial encounter: Secondary | ICD-10-CM | POA: Diagnosis not present

## 2015-05-07 ENCOUNTER — Ambulatory Visit (INDEPENDENT_AMBULATORY_CARE_PROVIDER_SITE_OTHER): Payer: Medicare Other | Admitting: Pulmonary Disease

## 2015-05-07 ENCOUNTER — Encounter: Payer: Self-pay | Admitting: Pulmonary Disease

## 2015-05-07 ENCOUNTER — Other Ambulatory Visit (INDEPENDENT_AMBULATORY_CARE_PROVIDER_SITE_OTHER): Payer: Medicare Other

## 2015-05-07 VITALS — BP 132/82 | HR 63 | Ht 72.0 in | Wt 191.8 lb

## 2015-05-07 DIAGNOSIS — G4733 Obstructive sleep apnea (adult) (pediatric): Secondary | ICD-10-CM | POA: Diagnosis not present

## 2015-05-07 DIAGNOSIS — R351 Nocturia: Secondary | ICD-10-CM | POA: Diagnosis not present

## 2015-05-07 DIAGNOSIS — I10 Essential (primary) hypertension: Secondary | ICD-10-CM

## 2015-05-07 DIAGNOSIS — N401 Enlarged prostate with lower urinary tract symptoms: Secondary | ICD-10-CM

## 2015-05-07 LAB — BASIC METABOLIC PANEL
BUN: 15 mg/dL (ref 6–23)
CHLORIDE: 102 meq/L (ref 96–112)
CO2: 30 mEq/L (ref 19–32)
Calcium: 9.2 mg/dL (ref 8.4–10.5)
Creatinine, Ser: 0.88 mg/dL (ref 0.40–1.50)
GFR: 91.45 mL/min (ref >60.00)
GLUCOSE: 100 mg/dL — AB (ref 70–99)
POTASSIUM: 4 meq/L (ref 3.5–5.1)
SODIUM: 138 meq/L (ref 135–145)

## 2015-05-07 LAB — CBC WITH DIFFERENTIAL/PLATELET
BASOS PCT: 0.5 % (ref 0.0–3.0)
Basophils Absolute: 0 10*3/uL (ref 0.0–0.1)
EOS PCT: 3.4 % (ref 0.0–5.0)
Eosinophils Absolute: 0.2 10*3/uL (ref 0.0–0.7)
HEMATOCRIT: 42.6 % (ref 39.0–52.0)
HEMOGLOBIN: 14.6 g/dL (ref 13.0–17.0)
LYMPHS PCT: 27.4 % (ref 12.0–46.0)
Lymphs Abs: 1.6 10*3/uL (ref 0.7–4.0)
MCHC: 34.3 g/dL (ref 30.0–36.0)
MCV: 84.7 fl (ref 78.0–100.0)
Monocytes Absolute: 0.5 10*3/uL (ref 0.1–1.0)
Monocytes Relative: 9.6 % (ref 3.0–12.0)
Neutro Abs: 3.4 10*3/uL (ref 1.4–7.7)
Neutrophils Relative %: 59.1 % (ref 43.0–77.0)
Platelets: 212 10*3/uL (ref 150.0–400.0)
RBC: 5.03 Mil/uL (ref 4.22–5.81)
RDW: 13.1 % (ref 11.5–15.5)
WBC: 5.7 10*3/uL (ref 4.0–10.5)

## 2015-05-07 LAB — POCT URINALYSIS DIPSTICK
BILIRUBIN UA: NEGATIVE
Blood, UA: NEGATIVE
GLUCOSE UA: NEGATIVE
Ketones, UA: NEGATIVE
LEUKOCYTES UA: NEGATIVE
NITRITE UA: NEGATIVE
PH UA: 7
Protein, UA: NEGATIVE
Spec Grav, UA: <=1.005
Urobilinogen, UA: 0.2

## 2015-05-07 LAB — HEPATIC FUNCTION PANEL
ALK PHOS: 47 U/L (ref 39–117)
ALT: 16 U/L (ref 0–53)
AST: 17 U/L (ref 0–37)
Albumin: 4.4 g/dL (ref 3.5–5.2)
BILIRUBIN DIRECT: 0.2 mg/dL (ref 0.0–0.3)
TOTAL PROTEIN: 7.1 g/dL (ref 6.0–8.3)
Total Bilirubin: 0.9 mg/dL (ref 0.2–1.2)

## 2015-05-07 LAB — LIPID PANEL
CHOL/HDL RATIO: 5
CHOLESTEROL: 162 mg/dL (ref 0–200)
HDL: 33.9 mg/dL — AB (ref >39.00)
NONHDL: 128.43
Triglycerides: 282 mg/dL — ABNORMAL HIGH (ref 0.0–149.0)
VLDL: 56.4 mg/dL — AB (ref 0.0–40.0)

## 2015-05-07 LAB — LDL CHOLESTEROL, DIRECT: LDL DIRECT: 72 mg/dL

## 2015-05-07 LAB — PSA: PSA: 1.46 ng/mL (ref 0.10–4.00)

## 2015-05-07 LAB — TSH: TSH: 0.89 u[IU]/mL (ref 0.35–4.50)

## 2015-05-07 NOTE — Progress Notes (Signed)
   Subjective:    Patient ID: Randall Kirby, male    DOB: 02-Jan-1946, 70 y.o.   MRN: [id number]  HPI Pt is here for f/u on his osa. Since last seen, he continues to have eye dryness. He saw his eye MD >> advised to hold off on cpap as he has scarred his cornea.  ROV 05/07/15 pt stopped cpap March 2017 2/2 severe eye dryness/scarring of cornea. Eyes not better with eye drops. Pt denies hypersomnia off cpap. He is fxnal. He was waking up frequently at night with cpap. No new medical dx since last seen. Pt was using nasal mask.    Review of Systems Eye drynes. (-) fevers/chills/sob/cough/nasal congestion. Has mouth dryness w/o cpap uses.  Rest of ROS (-)     Objective:   Physical Exam   Vitals:  Filed Vitals:   05/07/15 1410  BP: 132/82  Pulse: 63  Height: 6' (1.829 m)  Weight: 191 lb 12.8 oz (87 kg)  SpO2: 96%    Constitutional/General:  Pleasant, well-nourished, well-developed, not in any distress,  Comfortably seating.  Well kempt  Body mass index is 26.01 kg/(m^2). Wt Readings from Last 3 Encounters:  05/07/15 191 lb 12.8 oz (87 kg)  04/15/15 190 lb (86.183 kg)  07/18/14 194 lb (87.998 kg)    :   HEENT: Pupils equal and reactive to light and accommodation. Anicteric sclerae. Normal nasal mucosa.   No oral  lesions,  mouth clear,  oropharynx clear, no postnasal drip. (-) Oral thrush. No dental caries.  Airway - Mallampati class III  Neck: No masses. Midline trachea. No JVD, (-) LAD. (-) bruits appreciated.  Respiratory/Chest: Grossly normal chest. (-) deformity. (-) Accessory muscle use.  Symmetric expansion. (-) Tenderness on palpation.  Resonant on percussion.  Diminished BS on both lower lung zones. (-) wheezing, crackles, rhonchi (-) egophony  Cardiovascular: Regular rate and  rhythm, heart sounds normal, no murmur or gallops, no peripheral edema  Gastrointestinal:  Normal bowel sounds. Soft, non-tender. No hepatosplenomegaly.  (-) masses.    Musculoskeletal:  Normal muscle tone. Normal gait.   Extremities: Grossly normal. (-) clubbing, cyanosis.  (-) edema  Skin: (-) rash,lesions seen.   Neurological/Psychiatric : alert, oriented to time, place, person. Normal mood and affect           Assessment & Plan:  Obstructive sleep apnea Severe OSA 92005) AHI 32. On autocpap 5-20.  The patient tells me that he is wearing C Pap compliantly, but is having issues still  with dry eyes. It is unclear if he is having small leaks from his full face mask, or if he is getting air through his tear duct and into his eyes. He stopped cpap on 04/02/15. DL prior was 87%, AHI 5. On autocpap 5-20. Has a nasal mask. Plan: 1. Try CPAP at 5 cm water. 2. Try Amara mask. 3. If #1 and #2 don't work, will refer to dentistry for oral device. 4. Will need a 2 week DL on cpap 5.  5. Cont eye care. Stop cpap use if making eyeys worse.     RTC in 2-3 mos >> sooner if more sx.   Monica Becton, MD 05/07/2015, 2:49 PM [facility] Pager [phone] After 3 pm or if no answer, call [phone]

## 2015-05-07 NOTE — Patient Instructions (Signed)
1. We will decrease her CPAP pressure to 5 cm water. 2. We will try to order an [place] face mask. 3. If these changes don't work, please give [place] a call. We will refer you to dentistry for oral device.  Return to clinic in 38mos.

## 2015-05-07 NOTE — Assessment & Plan Note (Addendum)
Severe OSA 92005) AHI 32. On autocpap 5-20.  The patient tells me that he is wearing C Pap compliantly, but is having issues still  with dry eyes. It is unclear if he is having small leaks from his full face mask, or if he is getting air through his tear duct and into his eyes. He stopped cpap on 04/02/15. DL prior was 87%, AHI 5. On autocpap 5-20. Has a nasal mask. Plan: 1. Try CPAP at 5 cm water. 2. Try Amara mask. 3. If #1 and #2 don't work, will refer to dentistry for oral device. 4. Will need a 2 week DL on cpap 5.  5. Cont eye care. Stop cpap use if making eyeys worse.

## 2015-05-14 ENCOUNTER — Ambulatory Visit (INDEPENDENT_AMBULATORY_CARE_PROVIDER_SITE_OTHER): Payer: Medicare Other | Admitting: Family Medicine

## 2015-05-14 ENCOUNTER — Encounter: Payer: Self-pay | Admitting: Family Medicine

## 2015-05-14 VITALS — BP 140/90 | Temp 98.1°F | Ht 71.5 in | Wt 192.0 lb

## 2015-05-14 DIAGNOSIS — Z0000 Encounter for general adult medical examination without abnormal findings: Secondary | ICD-10-CM

## 2015-05-14 DIAGNOSIS — N401 Enlarged prostate with lower urinary tract symptoms: Secondary | ICD-10-CM | POA: Diagnosis not present

## 2015-05-14 DIAGNOSIS — I10 Essential (primary) hypertension: Secondary | ICD-10-CM | POA: Diagnosis not present

## 2015-05-14 DIAGNOSIS — R351 Nocturia: Secondary | ICD-10-CM

## 2015-05-14 DIAGNOSIS — Z23 Encounter for immunization: Secondary | ICD-10-CM

## 2015-05-14 DIAGNOSIS — Z1152 Encounter for screening for COVID-19: Secondary | ICD-10-CM | POA: Insufficient documentation

## 2015-05-14 MED ORDER — LISINOPRIL-HYDROCHLOROTHIAZIDE 20-12.5 MG PO TABS: ORAL_TABLET | ORAL | Status: DC

## 2015-05-14 NOTE — Progress Notes (Signed)
Pre visit review using our clinic review tool, if applicable. No additional management support is needed unless otherwise documented below in the visit note. 

## 2015-05-14 NOTE — Progress Notes (Signed)
   Subjective:    Patient ID: [name], male    DOB: 1945-11-05, 70 y.o.   MRN: [id number]  HPI Randall Kirby is a 70 year old married male nonsmoker who comes in today for general physical examination  He takes lisinopril 20-12 0.5 dose one half tab daily BP 140/90. His blood pressure at home runs 130/80.  He gets routine eye care, dental care, colonoscopy 2015 normal  He sees pulmonary for routine follow-up because of sleep apnea. He has a CPAP machine.  He has a history of skin cancer and sees his dermatologist yearly.  He's due a Pneumovax 23 which will be given today.  Cognitive function normal he walks on a daily basis home health safety reviewed no issues identified, no guns in the house, he does have a healthcare power of attorney and living well   Review of Systems  Constitutional: Negative.   HENT: Negative.   Eyes: Negative.   Respiratory: Negative.   Cardiovascular: Negative.   Gastrointestinal: Negative.   Endocrine: Negative.   Genitourinary: Negative.   Musculoskeletal: Negative.   Skin: Negative.   Allergic/Immunologic: Negative.   Neurological: Negative.   Hematological: Negative.   Psychiatric/Behavioral: Negative.        Objective:   Physical Exam  Constitutional: He is oriented to person, place, and time. He appears well-developed and well-nourished.  HENT:  Head: Normocephalic and atraumatic.  Right Ear: External ear normal.  Left Ear: External ear normal.  Nose: Nose normal.  Mouth/Throat: Oropharynx is clear and moist.  Eyes: Conjunctivae and EOM are normal. Pupils are equal, round, and reactive to light.  Neck: Normal range of motion. Neck supple. No JVD present. No tracheal deviation present. No thyromegaly present.  Cardiovascular: Normal rate, regular rhythm, normal heart sounds and intact distal pulses.  Exam reveals no gallop and no friction rub.   No murmur heard. No carotid nor aortic bruits peripheral pulses 2+ and symmetrical    Pulmonary/Chest: Effort normal and breath sounds normal. No stridor. No respiratory distress. He has no wheezes. He has no rales. He exhibits no tenderness.  Abdominal: Soft. Bowel sounds are normal. He exhibits no distension and no mass. There is no tenderness. There is no rebound and no guarding.  Genitourinary: Rectum normal and penis normal. Guaiac negative stool. No penile tenderness.  1+ symmetrical nonnodular BPH  Musculoskeletal: Normal range of motion. He exhibits no edema or tenderness.  Lymphadenopathy:    He has no cervical adenopathy.  Neurological: He is alert and oriented to person, place, and time. He has normal reflexes. No cranial nerve deficit. He exhibits normal muscle tone.  Skin: Skin is warm and dry. No rash noted. No erythema. No pallor.  Psychiatric: He has a normal mood and affect. His behavior is normal. Judgment and thought content normal.  Nursing note and vitals reviewed.         Assessment & Plan:  Healthy male  Hypertension at goal,,,,,,,, continue current therapy  Mild BPH asymptomatic,,,,,,,, observation follow-up PSA  History of sleep apnea,,,,,,, followed in pulmonary  History of skin cancer,,,,,,,, followed by dermatology is a exacerbated with elevation is a

## 2015-05-14 NOTE — Patient Instructions (Signed)
Continue current medications  Follow-up in one year sooner if any problems  Call in November for your physical examination in April

## 2015-05-27 ENCOUNTER — Telehealth: Payer: Self-pay

## 2015-05-27 NOTE — Telephone Encounter (Signed)
Calling pt to see if he has started back on CPAP yet. Per LOV he had been advised by his eye dr to stop until his eyes healed. Per [employer] there is no data to correlate with CPAP use.   LMTCB

## 2015-06-02 NOTE — Telephone Encounter (Signed)
Pt returning call about cpap and can be reached [phone] he says it's working fine since pressure has been adjusted.Randall Kirby

## 2015-06-02 NOTE — Telephone Encounter (Signed)
Do we need any further info from the pt? Thanks!

## 2015-06-02 NOTE — Telephone Encounter (Signed)
No, we should be good. I will get a DL for AD. Thanks!

## 2015-06-16 ENCOUNTER — Encounter: Payer: Self-pay | Admitting: Pulmonary Disease

## 2015-08-18 ENCOUNTER — Encounter: Payer: Self-pay | Admitting: Pulmonary Disease

## 2015-08-18 ENCOUNTER — Ambulatory Visit (INDEPENDENT_AMBULATORY_CARE_PROVIDER_SITE_OTHER): Payer: Medicare Other | Admitting: Pulmonary Disease

## 2015-08-18 VITALS — BP 142/80 | HR 66 | Ht 72.0 in | Wt 196.0 lb

## 2015-08-18 DIAGNOSIS — G4733 Obstructive sleep apnea (adult) (pediatric): Secondary | ICD-10-CM

## 2015-08-18 NOTE — Patient Instructions (Signed)
  It was a pleasure taking care of you today!  Continue using your CPAP machine.   Please make sure you use your CPAP device everytime you sleep.  We will monitor the usage of your machine per your insurance requirement.  Your insurance company may take the machine from you if you are not using it regularly.   Please clean the mask, tubings, filter, water reservoir with soapy water every week.  Please use distilled water for the water reservoir.   Please call the office or your machine provider (DME company) if you are having issues with the device.    We will try to call your company to get you new supplies.   Return to clinic in 1 year.

## 2015-08-18 NOTE — Progress Notes (Signed)
Subjective:    Patient ID: Randall Kirby, male    DOB: 07/24/1945, 70 y.o.   MRN: [id number]  HPI Pt is here for f/u on his osa. Since last seen, he continues to have eye dryness. He saw his eye MD >> advised to hold off on cpap as he has scarred his cornea.  ROV 05/07/15 pt stopped cpap March 2017 2/2 severe eye dryness/scarring of cornea. Eyes not better with eye drops. Pt denies hypersomnia off cpap. He is fxnal. He was waking up frequently at night with cpap. No new medical dx since last seen. Pt was using nasal mask.   ROV 08/18/15  Since decreasing the pressure, he has less eye irritation. He tolerates current CPAP set at 5 cm water. Feels better using it. More energy. Less sleepiness. He was not able to get new supplies or mask from [facility] since according to him, it's a different DME company taking care of his supplies.No new medical issues since last seen. Has not been admitted since last seen.                    Review of Systems Eye drynes but better. (-) fevers/chills/sob/cough/nasal congestion. Has mouth dryness w/o cpap uses.  Rest of ROS (-)     Objective:   Physical Exam   Vitals:  Filed Vitals:   08/18/15 1005  BP: 142/80  Pulse: 66  Height: 6' (1.829 m)  Weight: 196 lb (88.905 kg)  SpO2: 96%    Constitutional/General:  Pleasant, well-nourished, well-developed, not in any distress,  Comfortably seating.  Well kempt  Body mass index is 26.58 kg/(m^2). Wt Readings from Last 3 Encounters:  08/18/15 196 lb (88.905 kg)  05/14/15 192 lb (87.091 kg)  05/07/15 191 lb 12.8 oz (87 kg)    :   HEENT: Pupils equal and reactive to light and accommodation. Anicteric sclerae. Normal nasal mucosa.   No oral  lesions,  mouth clear,  oropharynx clear, no postnasal drip. (-) Oral thrush. No dental caries.  Airway - Mallampati class III  Neck: No masses. Midline trachea. No JVD, (-) LAD. (-) bruits appreciated.  Respiratory/Chest: Grossly normal chest. (-) deformity.  (-) Accessory muscle use.  Symmetric expansion. (-) Tenderness on palpation.  Resonant on percussion.  Diminished BS on both lower lung zones. (-) wheezing, crackles, rhonchi (-) egophony  Cardiovascular: Regular rate and  rhythm, heart sounds normal, no murmur or gallops, no peripheral edema  Gastrointestinal:  Normal bowel sounds. Soft, non-tender. No hepatosplenomegaly.  (-) masses.   Musculoskeletal:  Normal muscle tone. Normal gait.   Extremities: Grossly normal. (-) clubbing, cyanosis.  (-) edema  Skin: (-) rash,lesions seen.   Neurological/Psychiatric : alert, oriented to time, place, person. Normal mood and affect           Assessment & Plan:  Obstructive sleep apnea Severe OSA (2005) AHI 32. On autocpap 5-20.  He had issues with CPAP machine causing his eyes to be dry. He became intolerant. We switched his settings from auto CPAP 5-15 to CPAP of 5 cm water. Download on CPAP of 5 cm water : AHi 6.5, 96% compliant. Less eye issues with current settings. Better using it. Feels benefit of CPAP.  We extensively discussed the importance of treating OSA and the need to use PAP therapy.   Continue with  CPAP 5 cm water.  We will try to let him get new mask : Amara or nasal pillows or For Her nasal mask as  these mask potentially will cause less eye iritation. Will order via [facility].    Patient was instructed to have mask, tubings, filter, reservoir cleaned at least once a week with soapy water.  Patient was instructed to call the office if he/she is having issues with the PAP device.    I advised patient to obtain sufficient amount of sleep --  7 to 8 hours at least in a 24 hr period.  Patient was advised to follow good sleep hygiene.  Patient was advised NOT to engage in activities requiring concentration and/or vigilance if he/she is and  sleepy.  Patient is NOT to drive if he/she is sleepy.      RTC in  1 yr. Lenna Sciara. Shirl Harris, MD 08/18/2015, 10:32  AM [facility] Pager [phone] After 3 pm or if no answer, call [phone]

## 2015-08-18 NOTE — Assessment & Plan Note (Signed)
Severe OSA (2005) AHI 32. On autocpap 5-20.  He had issues with CPAP machine causing his eyes to be dry. He became intolerant. We switched his settings from auto CPAP 5-15 to CPAP of 5 cm water. Download on CPAP of 5 cm water : AHi 6.5, 96% compliant. Less eye issues with current settings. Better using it. Feels benefit of CPAP.  We extensively discussed the importance of treating OSA and the need to use PAP therapy.   Continue with  CPAP 5 cm water.  We will try to let him get new mask : Amara or nasal pillows or For Her nasal mask as these mask potentially will cause less eye iritation. Will order via [facility].    Patient was instructed to have mask, tubings, filter, reservoir cleaned at least once a week with soapy water.  Patient was instructed to call the office if he/she is having issues with the PAP device.    I advised patient to obtain sufficient amount of sleep --  7 to 8 hours at least in a 24 hr period.  Patient was advised to follow good sleep hygiene.  Patient was advised NOT to engage in activities requiring concentration and/or vigilance if he/she is and  sleepy.  Patient is NOT to drive if he/she is sleepy.

## 2015-08-20 ENCOUNTER — Telehealth: Payer: Self-pay | Admitting: Pulmonary Disease

## 2015-08-20 DIAGNOSIS — G4733 Obstructive sleep apnea (adult) (pediatric): Secondary | ICD-10-CM

## 2015-08-20 NOTE — Telephone Encounter (Signed)
Called spoke with pt. Aware no order was placed for a new CPAP mask. I have done so and sent it STAT. Nothing further needed

## 2015-08-21 ENCOUNTER — Encounter: Payer: Self-pay | Admitting: Pulmonary Disease

## 2015-08-21 NOTE — Addendum Note (Signed)
Addended by: Beckie Busing on: 08/21/2015 02:32 PM   Modules accepted: Orders

## 2015-09-12 DIAGNOSIS — H04123 Dry eye syndrome of bilateral lacrimal glands: Secondary | ICD-10-CM | POA: Diagnosis not present

## 2015-09-12 DIAGNOSIS — H40053 Ocular hypertension, bilateral: Secondary | ICD-10-CM | POA: Diagnosis not present

## 2015-09-12 DIAGNOSIS — H1789 Other corneal scars and opacities: Secondary | ICD-10-CM | POA: Diagnosis not present

## 2015-09-12 DIAGNOSIS — H40013 Open angle with borderline findings, low risk, bilateral: Secondary | ICD-10-CM | POA: Diagnosis not present

## 2015-11-13 DIAGNOSIS — H40012 Open angle with borderline findings, low risk, left eye: Secondary | ICD-10-CM | POA: Diagnosis not present

## 2015-11-13 DIAGNOSIS — H40011 Open angle with borderline findings, low risk, right eye: Secondary | ICD-10-CM | POA: Diagnosis not present

## 2016-01-19 ENCOUNTER — Ambulatory Visit (INDEPENDENT_AMBULATORY_CARE_PROVIDER_SITE_OTHER): Payer: Medicare Other | Admitting: Emergency Medicine

## 2016-01-19 DIAGNOSIS — Z23 Encounter for immunization: Secondary | ICD-10-CM | POA: Diagnosis not present

## 2016-04-19 DIAGNOSIS — L821 Other seborrheic keratosis: Secondary | ICD-10-CM | POA: Diagnosis not present

## 2016-04-19 DIAGNOSIS — L570 Actinic keratosis: Secondary | ICD-10-CM | POA: Diagnosis not present

## 2016-04-19 DIAGNOSIS — L814 Other melanin hyperpigmentation: Secondary | ICD-10-CM | POA: Diagnosis not present

## 2016-04-19 DIAGNOSIS — Z85828 Personal history of other malignant neoplasm of skin: Secondary | ICD-10-CM | POA: Diagnosis not present

## 2016-04-19 DIAGNOSIS — D2262 Melanocytic nevi of left upper limb, including shoulder: Secondary | ICD-10-CM | POA: Diagnosis not present

## 2016-04-19 DIAGNOSIS — C44612 Basal cell carcinoma of skin of right upper limb, including shoulder: Secondary | ICD-10-CM | POA: Diagnosis not present

## 2016-04-19 DIAGNOSIS — D225 Melanocytic nevi of trunk: Secondary | ICD-10-CM | POA: Diagnosis not present

## 2016-05-17 DIAGNOSIS — H524 Presbyopia: Secondary | ICD-10-CM | POA: Diagnosis not present

## 2016-05-17 DIAGNOSIS — H25813 Combined forms of age-related cataract, bilateral: Secondary | ICD-10-CM | POA: Diagnosis not present

## 2016-05-17 DIAGNOSIS — H1789 Other corneal scars and opacities: Secondary | ICD-10-CM | POA: Diagnosis not present

## 2016-05-17 DIAGNOSIS — H04123 Dry eye syndrome of bilateral lacrimal glands: Secondary | ICD-10-CM | POA: Diagnosis not present

## 2016-07-19 ENCOUNTER — Other Ambulatory Visit: Payer: Self-pay | Admitting: Family Medicine

## 2016-07-19 DIAGNOSIS — I10 Essential (primary) hypertension: Secondary | ICD-10-CM

## 2016-07-19 NOTE — Telephone Encounter (Signed)
Filled once.  Has upcoming appt with Dr. Sherren Mocha 10/2016.

## 2016-08-31 ENCOUNTER — Other Ambulatory Visit: Payer: Self-pay

## 2016-09-17 ENCOUNTER — Encounter: Payer: Self-pay | Admitting: Pulmonary Disease

## 2016-09-17 ENCOUNTER — Ambulatory Visit (INDEPENDENT_AMBULATORY_CARE_PROVIDER_SITE_OTHER): Payer: Medicare Other | Admitting: Pulmonary Disease

## 2016-09-17 VITALS — BP 122/62 | HR 59 | Ht 72.0 in | Wt 196.2 lb

## 2016-09-17 DIAGNOSIS — G4733 Obstructive sleep apnea (adult) (pediatric): Secondary | ICD-10-CM

## 2016-09-17 NOTE — Patient Instructions (Signed)
Call if you want to set up an oral appliance to treat sleep apnea  Follow up in 1 year

## 2016-09-17 NOTE — Progress Notes (Signed)
Current Outpatient Prescriptions on File Prior to Visit  Medication Sig  . aspirin 81 MG tablet Take 81 mg by mouth daily.    Marland Kitchen lisinopril-hydrochlorothiazide (PRINZIDE,ZESTORETIC) 20-12.5 MG tablet TAKE 1/2 TABLET BY MOUTH DAILY.   No current facility-administered medications on file prior to visit.      Chief Complaint  Patient presents with  . Follow-up    Pt wears CPAP nightly. C/o dry eyes. Pt states that the air blows heavy in his eyes causing dry and scratchy eyes. Pt has to use lubricant eye drops every night. DME: [facility]     Sleep tests PSG 04/09/03 >> AHI 32 CPAP 06/19/16 to 09/16/16 >> used on 90 of 90 nights with average 8 hrs 24 min.  Average AHI 4.2 with CPAP 5 cm H2O  Past medical history HTN  Past surgical history, Family history, Social history, Allergies all reviewed.  Vital Signs BP 122/62 (BP Location: Left Arm, Cuff Size: Normal)   Pulse (!) 59   Ht 6' (1.829 m)   Wt 196 lb 3.2 oz (89 kg)   SpO2 97%   BMI 26.61 kg/m   History of Present Illness Randall Kirby is a 71 y.o. male with OSA.  He uses CPAP nightly.  He has nasal pillows mask.  He gets air leaking from his tear ducts.  This causes dry eyes.    Physical Exam  General - No distress ENT - No sinus tenderness, no oral exudate, no LAN Cardiac - s1s2 regular, no murmur Chest - No wheeze/rales/dullness Back - No focal tenderness Abd - Soft, non-tender Ext - No edema Neuro - Normal strength Skin - No rashes Psych - normal mood, and behavior   Assessment/Plan  Obstructive sleep apnea. - he is compliant with CPAP and reports benefit - will try replacement mask and continue CPAP 5 cm H2O - if no improvement, then might need to try oral appliance   Patient Instructions  Call if you want to set up an oral appliance to treat sleep apnea  Follow up in 1 year    Chesley Mires, MD [place] Pager:  [phone] 09/17/2016, 4:30 PM

## 2016-10-19 ENCOUNTER — Ambulatory Visit (INDEPENDENT_AMBULATORY_CARE_PROVIDER_SITE_OTHER): Payer: Medicare Other | Admitting: Family Medicine

## 2016-10-19 ENCOUNTER — Encounter: Payer: Self-pay | Admitting: Family Medicine

## 2016-10-19 VITALS — BP 128/80 | HR 60 | Temp 98.1°F | Wt 196.0 lb

## 2016-10-19 DIAGNOSIS — Z23 Encounter for immunization: Secondary | ICD-10-CM

## 2016-10-19 DIAGNOSIS — R351 Nocturia: Secondary | ICD-10-CM | POA: Diagnosis not present

## 2016-10-19 DIAGNOSIS — I10 Essential (primary) hypertension: Secondary | ICD-10-CM

## 2016-10-19 DIAGNOSIS — N401 Enlarged prostate with lower urinary tract symptoms: Secondary | ICD-10-CM

## 2016-10-19 LAB — LIPID PANEL
CHOLESTEROL: 142 mg/dL (ref 0–200)
HDL: 30.8 mg/dL — ABNORMAL LOW (ref >39.00)
NonHDL: 111.62
Total CHOL/HDL Ratio: 5
Triglycerides: 215 mg/dL — ABNORMAL HIGH (ref 0.0–149.0)
VLDL: 43 mg/dL — ABNORMAL HIGH (ref 0.0–40.0)

## 2016-10-19 LAB — HEPATIC FUNCTION PANEL
ALT: 13 U/L (ref 0–53)
AST: 21 U/L (ref 0–37)
Albumin: 4.2 g/dL (ref 3.5–5.2)
Alkaline Phosphatase: 43 U/L (ref 39–117)
BILIRUBIN TOTAL: 0.7 mg/dL (ref 0.2–1.2)
Bilirubin, Direct: 0.1 mg/dL (ref 0.0–0.3)
TOTAL PROTEIN: 6.7 g/dL (ref 6.0–8.3)

## 2016-10-19 LAB — CBC WITH DIFFERENTIAL/PLATELET
BASOS ABS: 0 10*3/uL (ref 0.0–0.1)
BASOS PCT: 1 % (ref 0.0–3.0)
EOS ABS: 0.2 10*3/uL (ref 0.0–0.7)
Eosinophils Relative: 3.8 % (ref 0.0–5.0)
HCT: 42 % (ref 39.0–52.0)
HEMOGLOBIN: 14.2 g/dL (ref 13.0–17.0)
Lymphocytes Relative: 30.1 % (ref 12.0–46.0)
Lymphs Abs: 1.5 10*3/uL (ref 0.7–4.0)
MCHC: 33.8 g/dL (ref 30.0–36.0)
MCV: 87.6 fl (ref 78.0–100.0)
MONO ABS: 0.4 10*3/uL (ref 0.1–1.0)
Monocytes Relative: 8.7 % (ref 3.0–12.0)
Neutro Abs: 2.8 10*3/uL (ref 1.4–7.7)
Neutrophils Relative %: 56.4 % (ref 43.0–77.0)
Platelets: 194 10*3/uL (ref 150.0–400.0)
RBC: 4.8 Mil/uL (ref 4.22–5.81)
RDW: 12.9 % (ref 11.5–15.5)
WBC: 4.9 10*3/uL (ref 4.0–10.5)

## 2016-10-19 LAB — TSH: TSH: 1.82 u[IU]/mL (ref 0.35–4.50)

## 2016-10-19 LAB — POCT URINALYSIS DIPSTICK
Bilirubin, UA: NEGATIVE
Glucose, UA: NEGATIVE
Ketones, UA: NEGATIVE
Leukocytes, UA: NEGATIVE
Nitrite, UA: NEGATIVE
PROTEIN UA: NEGATIVE
RBC UA: NEGATIVE
SPEC GRAV UA: 1.02 (ref 1.010–1.025)
UROBILINOGEN UA: 0.2 U/dL
pH, UA: 6.5 (ref 5.0–8.0)

## 2016-10-19 LAB — BASIC METABOLIC PANEL
BUN: 14 mg/dL (ref 6–23)
CO2: 30 meq/L (ref 19–32)
Calcium: 9 mg/dL (ref 8.4–10.5)
Chloride: 102 mEq/L (ref 96–112)
Creatinine, Ser: 0.94 mg/dL (ref 0.40–1.50)
GFR: 84.38 mL/min (ref >60.00)
GLUCOSE: 95 mg/dL (ref 70–99)
Potassium: 4 mEq/L (ref 3.5–5.1)
Sodium: 139 mEq/L (ref 135–145)

## 2016-10-19 LAB — PSA: PSA: 1.91 ng/mL (ref 0.10–4.00)

## 2016-10-19 LAB — LDL CHOLESTEROL, DIRECT: LDL DIRECT: 71 mg/dL

## 2016-10-19 MED ORDER — LISINOPRIL-HYDROCHLOROTHIAZIDE 20-12.5 MG PO TABS: 0.5000 | ORAL_TABLET | Freq: Every day | ORAL | 3 refills | Status: DC

## 2016-10-19 NOTE — Progress Notes (Signed)
1 white cells show up Randall Kirby is a delightful 71 year old married male nonsmoker who comes in today for evaluation of hypertension and BPH  He's had a long-standing history of hypertension well controlled with Zestoretic 20-12 0.5. He takes a half a tablet daily. BP 120/80 pulse 68 regular. His activity level is good he walks on a regular basis he mows grass. Cyst sometimes he feels well shortness of breath. Denies any chest pain.  He's had a history of BPH and nocturia 2. He needs a follow-up PSA.  He sees his ophthalmologist on a regular basis cousin has bilateral cataracts.  He sees pulmonary regular basis because he has CPAP because of sleep apnea syndrome.  He sees his dermatologist on a regular basis cousin's had 2 basal cell carcinomas. One on his nose one on his right forearm.  Vaccinations up-to-date except he is due this new shingles vaccine and tetanus flu shot which will be given today.  Colonoscopy 2015 normal.  14 point review of systems reviewed and otherwise negative  BP 128/80 (BP Location: Left Arm, Patient Position: Sitting, Cuff Size: Normal)   Pulse 60   Temp 98.1 F (36.7 C) (Oral)   Wt 196 lb (88.9 kg)   BMI 26.58 kg/m  Examination of the HEENT were negative except for bilateral cataracts neck was supple thyroid not enlarged no carotid bruits cardiopulmonary exam normal abdominal exam normal skin normal except for scar right nose and right forearm from previous skin cancer removal  #1 hypertension at goal........ continue current therapy...Marland KitchenMarland KitchenMarland Kitchen check EKG and labs  #2 BPH....... check PSA  #3 bilateral cataracts.......... continue follow-up by ophthalmologist  #4 sleep apnea....... continue CPAP and followed by pulmonary

## 2016-10-19 NOTE — Patient Instructions (Signed)
Continue current medication diet and exercise program  Continue followed by ophthalmology and dermatology in pulmonary as outlined  Call your insurance company and find out where you get the shingles vaccine the cheapest  Labs today,,,,,,,,,,,, I will call you if there is anything abnormal.,

## 2016-10-25 ENCOUNTER — Telehealth: Payer: Self-pay | Admitting: Family Medicine

## 2016-10-25 NOTE — Telephone Encounter (Signed)
Pt would like to have a copy of the most recent labs that he had with Dr. Sherren Mocha mailed to the address on file it was confirmed.

## 2016-10-26 NOTE — Telephone Encounter (Signed)
Pt lab results have been printed and sent out to the Address provided in the chart.

## 2017-01-21 DIAGNOSIS — H5213 Myopia, bilateral: Secondary | ICD-10-CM | POA: Diagnosis not present

## 2017-01-21 DIAGNOSIS — H25813 Combined forms of age-related cataract, bilateral: Secondary | ICD-10-CM | POA: Diagnosis not present

## 2017-01-21 DIAGNOSIS — H04123 Dry eye syndrome of bilateral lacrimal glands: Secondary | ICD-10-CM | POA: Diagnosis not present

## 2017-01-21 DIAGNOSIS — H1789 Other corneal scars and opacities: Secondary | ICD-10-CM | POA: Diagnosis not present

## 2017-04-05 DIAGNOSIS — H268 Other specified cataract: Secondary | ICD-10-CM | POA: Diagnosis not present

## 2017-04-05 DIAGNOSIS — H25811 Combined forms of age-related cataract, right eye: Secondary | ICD-10-CM | POA: Diagnosis not present

## 2017-04-20 DIAGNOSIS — D1801 Hemangioma of skin and subcutaneous tissue: Secondary | ICD-10-CM | POA: Diagnosis not present

## 2017-04-20 DIAGNOSIS — L570 Actinic keratosis: Secondary | ICD-10-CM | POA: Diagnosis not present

## 2017-04-20 DIAGNOSIS — D2262 Melanocytic nevi of left upper limb, including shoulder: Secondary | ICD-10-CM | POA: Diagnosis not present

## 2017-04-20 DIAGNOSIS — Z85828 Personal history of other malignant neoplasm of skin: Secondary | ICD-10-CM | POA: Diagnosis not present

## 2017-04-20 DIAGNOSIS — D225 Melanocytic nevi of trunk: Secondary | ICD-10-CM | POA: Diagnosis not present

## 2017-04-20 DIAGNOSIS — L821 Other seborrheic keratosis: Secondary | ICD-10-CM | POA: Diagnosis not present

## 2017-04-28 DIAGNOSIS — L570 Actinic keratosis: Secondary | ICD-10-CM | POA: Diagnosis not present

## 2017-04-28 DIAGNOSIS — Z85828 Personal history of other malignant neoplasm of skin: Secondary | ICD-10-CM | POA: Diagnosis not present

## 2017-04-28 DIAGNOSIS — L308 Other specified dermatitis: Secondary | ICD-10-CM | POA: Diagnosis not present

## 2017-05-10 DIAGNOSIS — H25812 Combined forms of age-related cataract, left eye: Secondary | ICD-10-CM | POA: Diagnosis not present

## 2017-05-10 DIAGNOSIS — H268 Other specified cataract: Secondary | ICD-10-CM | POA: Diagnosis not present

## 2017-10-24 ENCOUNTER — Ambulatory Visit (INDEPENDENT_AMBULATORY_CARE_PROVIDER_SITE_OTHER): Payer: Medicare Other | Admitting: Family Medicine

## 2017-10-24 ENCOUNTER — Ambulatory Visit (INDEPENDENT_AMBULATORY_CARE_PROVIDER_SITE_OTHER): Payer: Medicare Other

## 2017-10-24 ENCOUNTER — Encounter: Payer: Self-pay | Admitting: Family Medicine

## 2017-10-24 VITALS — BP 124/82 | HR 74 | Temp 97.7°F | Wt 193.0 lb

## 2017-10-24 DIAGNOSIS — Z23 Encounter for immunization: Secondary | ICD-10-CM

## 2017-10-24 DIAGNOSIS — R0602 Shortness of breath: Secondary | ICD-10-CM

## 2017-10-24 DIAGNOSIS — R351 Nocturia: Secondary | ICD-10-CM | POA: Diagnosis not present

## 2017-10-24 DIAGNOSIS — N401 Enlarged prostate with lower urinary tract symptoms: Secondary | ICD-10-CM

## 2017-10-24 DIAGNOSIS — I10 Essential (primary) hypertension: Secondary | ICD-10-CM | POA: Diagnosis not present

## 2017-10-24 LAB — CBC WITH DIFFERENTIAL/PLATELET
BASOS ABS: 0.1 10*3/uL (ref 0.0–0.1)
Basophils Relative: 1.2 % (ref 0.0–3.0)
EOS PCT: 3.1 % (ref 0.0–5.0)
Eosinophils Absolute: 0.1 10*3/uL (ref 0.0–0.7)
HEMATOCRIT: 43 % (ref 39.0–52.0)
Hemoglobin: 14.8 g/dL (ref 13.0–17.0)
LYMPHS ABS: 1.3 10*3/uL (ref 0.7–4.0)
LYMPHS PCT: 28 % (ref 12.0–46.0)
MCHC: 34.4 g/dL (ref 30.0–36.0)
MCV: 85.6 fl (ref 78.0–100.0)
MONOS PCT: 9.7 % (ref 3.0–12.0)
Monocytes Absolute: 0.4 10*3/uL (ref 0.1–1.0)
NEUTROS ABS: 2.7 10*3/uL (ref 1.4–7.7)
NEUTROS PCT: 58 % (ref 43.0–77.0)
PLATELETS: 191 10*3/uL (ref 150.0–400.0)
RBC: 5.02 Mil/uL (ref 4.22–5.81)
RDW: 12.9 % (ref 11.5–15.5)
WBC: 4.6 10*3/uL (ref 4.0–10.5)

## 2017-10-24 LAB — POCT URINALYSIS DIPSTICK
Bilirubin, UA: NEGATIVE
Blood, UA: NEGATIVE
GLUCOSE UA: NEGATIVE
Ketones, UA: NEGATIVE
LEUKOCYTES UA: NEGATIVE
NITRITE UA: NEGATIVE
PROTEIN UA: NEGATIVE
Spec Grav, UA: 1.025 (ref 1.010–1.025)
Urobilinogen, UA: 0.2 E.U./dL
pH, UA: 6 (ref 5.0–8.0)

## 2017-10-24 LAB — LIPID PANEL
CHOL/HDL RATIO: 5
CHOLESTEROL: 148 mg/dL (ref 0–200)
HDL: 30.3 mg/dL — ABNORMAL LOW (ref >39.00)
NONHDL: 117.57
Triglycerides: 227 mg/dL — ABNORMAL HIGH (ref 0.0–149.0)
VLDL: 45.4 mg/dL — AB (ref 0.0–40.0)

## 2017-10-24 LAB — HEPATIC FUNCTION PANEL
ALK PHOS: 48 U/L (ref 39–117)
ALT: 12 U/L (ref 0–53)
AST: 15 U/L (ref 0–37)
Albumin: 4.3 g/dL (ref 3.5–5.2)
BILIRUBIN DIRECT: 0.1 mg/dL (ref 0.0–0.3)
TOTAL PROTEIN: 7 g/dL (ref 6.0–8.3)
Total Bilirubin: 0.8 mg/dL (ref 0.2–1.2)

## 2017-10-24 LAB — BASIC METABOLIC PANEL
BUN: 16 mg/dL (ref 6–23)
CHLORIDE: 103 meq/L (ref 96–112)
CO2: 32 meq/L (ref 19–32)
Calcium: 9.1 mg/dL (ref 8.4–10.5)
Creatinine, Ser: 0.94 mg/dL (ref 0.40–1.50)
GFR: 84.14 mL/min (ref >60.00)
Glucose, Bld: 103 mg/dL — ABNORMAL HIGH (ref 70–99)
POTASSIUM: 4.7 meq/L (ref 3.5–5.1)
Sodium: 138 mEq/L (ref 135–145)

## 2017-10-24 LAB — PSA: PSA: 1.38 ng/mL (ref 0.10–4.00)

## 2017-10-24 LAB — LDL CHOLESTEROL, DIRECT: LDL DIRECT: 86 mg/dL

## 2017-10-24 LAB — TSH: TSH: 1.41 u[IU]/mL (ref 0.35–4.50)

## 2017-10-24 MED ORDER — LISINOPRIL-HYDROCHLOROTHIAZIDE 20-12.5 MG PO TABS: 0.5000 | ORAL_TABLET | Freq: Every day | ORAL | 3 refills | Status: DC

## 2017-10-24 NOTE — Patient Instructions (Signed)
Labs today............. We will call you if there is anything abnormal  Your EKG shows some PACs.......Randall Kirby Which I don't think are related thing bad however we will get you set up for a cardiac consult for confirmation  Your chest x-ray to my eye looks normal.......... We will have the radiologist look at it also and if there is a problem we'll call you

## 2017-10-24 NOTE — Progress Notes (Signed)
Artemio is a 72 year old male nonsmoker who comes in today for evaluation of shortness of breath for 2-3 months  He states he noticed to 3 months ago he had sensation of shortness of breath at rest. He says he would take a few breaths and it went away. He denies any chest pain.  He has no chest pain or shortness of breath with physical activity. He's very active he washes his car he does a lot of yard work and has no chest pain or shortness of breath  He takes lisinopril 5 mg daily because of a history of hypertension. BP today 124/82.  He's never had a history of any pulmonary disease he smoked for 2 years when he was a young man in his 57s.  Never had a history of cardiac disease. No history of rapid heart rate arrhythmias etc.  BP 124/82 (BP Location: Left Arm, Patient Position: Sitting, Cuff Size: Large)   Pulse 74   Temp 97.7 F (36.5 C) (Oral)   Wt 193 lb (87.5 kg)   SpO2 97%   BMI 26.18 kg/m  Well-developed well-nourished male no acute distress vital signs stable he is afebrile cardiopulmonary exams were perfectly normal.  EKG pending.........Marland Kitchenfrequent PVCs  Chest x-ray pending...........no obvious abnormality  #1 shortness of breath............Marland Kitchenetiology unknown............ cardiac consult

## 2017-10-25 NOTE — Progress Notes (Signed)
Cardiology Office Note   Date:  10/26/2017   ID:  Randall Kirby, Randall Kirby [birth year], MRN [id number]  PCP:  Dorena Cookey, MD  Cardiologist:   Peter Martinique, MD   Chief Complaint  Patient presents with  . Shortness of Breath      History of Present Illness: Randall Kirby is a 72 y.o. male who is seen at the request of Dr. Sherren Mocha for evaluation of SOB. She has a history of HTN and OSA.  He states recently he has experienced shortness of breath when he is just sitting around. Feels like he has to take 2 or 3 deep breaths. No dyspnea on exertion. Denies chest pain, palpitations, edema, or cough. Stays active doing yard work and washing cars. Hasn't used CPAP this year because it was blowing in his eyes and bothering him. Sleeps better without it. Only smoked a couple of years when he was [age]. 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   Past Medical History:  Diagnosis Date  . Hypertension   . Sleep apnea     Past Surgical History:  Procedure Laterality Date  . CHOLECYSTECTOMY    . lumbar HNP    . SKIN CANCER EXCISION     nose     Current Outpatient Medications  Medication Sig Dispense Refill  . aspirin 81 MG tablet Take 81 mg by mouth daily.      Marland Kitchen lisinopril-hydrochlorothiazide (PRINZIDE,ZESTORETIC) 20-12.5 MG tablet Take 0.5 tablets by mouth daily. 100 tablet 3   No current facility-administered medications for this visit.     Allergies:   Penicillins    Social History:  The patient  reports that he quit smoking about 51 years ago. His smoking use included cigarettes. He has a 3.00 pack-year smoking history. He has never used smokeless tobacco. He reports that he drinks alcohol. He reports that he does not use drugs.   Family History:  The patient's family history includes Asthma in his other; Heart Problems in his mother; Heart disease in his other.    ROS:  Please see the history of present illness.   Otherwise, review of systems are positive for .   All other systems  are reviewed and negative.    PHYSICAL EXAM: VS:  BP 124/77   Pulse 86   Ht 6' (1.829 m)   Wt 192 lb 12.8 oz (87.5 kg)   BMI 26.15 kg/m  , BMI Body mass index is 26.15 kg/m. GEN: Well nourished, well developed, in no acute distress  HEENT: normal  Neck: no JVD, carotid bruits, or masses Cardiac: RRR; no murmurs, rubs, or gallops,no edema  Respiratory:  clear to auscultation bilaterally, normal work of breathing GI: soft, nontender, nondistended, + BS MS: no deformity or atrophy  Skin: warm and dry, no rash Neuro:  Strength and sensation are intact Psych: euthymic mood, full affect   EKG:  EKG is not ordered today. The ekg ordered 10/24/17 demonstrates NSR with frequent PACs. LAD. I have personally reviewed and interpreted this study.    Recent Labs: 10/24/2017: ALT 12; BUN 16; Creatinine, Ser 0.94; Hemoglobin 14.8; Platelets 191.0; Potassium 4.7; Sodium 138; TSH 1.41    Lipid Panel    Component Value Date/Time   CHOL 148 10/24/2017 1017   TRIG 227.0 (H) 10/24/2017 1017   HDL 30.30 (L) 10/24/2017 1017   CHOLHDL 5 10/24/2017 1017   VLDL 45.4 (H) 10/24/2017 1017   LDLCALC 71 04/09/2014 1019   LDLDIRECT 86.0 10/24/2017 1017  Wt Readings from Last 3 Encounters:  10/26/17 192 lb 12.8 oz (87.5 kg)  10/24/17 193 lb (87.5 kg)  10/19/16 196 lb (88.9 kg)      Other studies Reviewed: Additional studies/ records that were reviewed today include:  CHEST - 2 VIEW  COMPARISON:  None.  FINDINGS: Mild hyperinflation of the lungs. Heart is normal size. Lungs clear. No effusions or acute bony abnormality.  IMPRESSION: Mild hyperinflation.  No active disease.   Electronically Signed   By: Rolm Baptise M.D.   On: 10/24/2017 11:04   ASSESSMENT AND PLAN:  1.  Shortness of breath. Symptoms are nonspecific and occur mostly at rest. Doubt ischemia. Noted to have PACs recently. Will arrange for Echocardiogram. If this looks OK we will reassure 2. PACs  asymptomatic 3. HTN 4. OSA. Not currently using CPAP.   Current medicines are reviewed at length with the patient today.  The patient does not have concerns regarding medicines.  The following changes have been made:  no change  Labs/ tests ordered today include: Echo    Disposition:   FU with me prn  Signed, Peter Martinique, MD  10/26/2017 10:28 AM    [facility] [address], [place], [place], [address] Phone [phone], Fax [phone]

## 2017-10-26 ENCOUNTER — Ambulatory Visit (INDEPENDENT_AMBULATORY_CARE_PROVIDER_SITE_OTHER): Payer: Medicare Other | Admitting: Cardiology

## 2017-10-26 ENCOUNTER — Encounter: Payer: Self-pay | Admitting: Cardiology

## 2017-10-26 VITALS — BP 124/77 | HR 86 | Ht 72.0 in | Wt 192.8 lb

## 2017-10-26 DIAGNOSIS — I491 Atrial premature depolarization: Secondary | ICD-10-CM | POA: Diagnosis not present

## 2017-10-26 DIAGNOSIS — I10 Essential (primary) hypertension: Secondary | ICD-10-CM | POA: Diagnosis not present

## 2017-10-26 DIAGNOSIS — R0602 Shortness of breath: Secondary | ICD-10-CM

## 2017-10-26 NOTE — Patient Instructions (Addendum)
We will schedule you for an Echocardiogram- is a painless test that uses sound waves to create images of your heart. It provides your doctor with information about the size and shape of your heart and how well your heart's chambers and valves are working. This procedure takes approximately one hour. There are no restrictions for this procedure. This will be performed at our [facility] location - [address].

## 2017-10-28 ENCOUNTER — Telehealth: Payer: Self-pay | Admitting: Family Medicine

## 2017-10-28 NOTE — Telephone Encounter (Signed)
Copied from [place] [id number]. Topic: Quick Communication - See Telephone Encounter >> Oct 28, 2017 12:12 PM Hewitt Shorts wrote: Pt is requesting a copy of his lab work from Dr/ Sherren Mocha on 10/24/17 sent to the home address  Best number [phone]

## 2017-10-28 NOTE — Telephone Encounter (Signed)
Labs mailed. Pt wife notified!

## 2017-11-15 ENCOUNTER — Other Ambulatory Visit: Payer: Self-pay

## 2017-11-15 ENCOUNTER — Ambulatory Visit (HOSPITAL_COMMUNITY): Payer: Medicare Other | Attending: Cardiology

## 2017-11-15 DIAGNOSIS — R0602 Shortness of breath: Secondary | ICD-10-CM | POA: Diagnosis not present

## 2017-11-17 ENCOUNTER — Other Ambulatory Visit: Payer: Self-pay

## 2017-11-17 DIAGNOSIS — R0602 Shortness of breath: Secondary | ICD-10-CM

## 2017-11-28 ENCOUNTER — Other Ambulatory Visit: Payer: Self-pay

## 2017-11-28 ENCOUNTER — Ambulatory Visit (INDEPENDENT_AMBULATORY_CARE_PROVIDER_SITE_OTHER)
Admission: RE | Admit: 2017-11-28 | Discharge: 2017-11-28 | Disposition: A | Discharge status: Home / Self Care | Payer: Medicare Other | Source: Ambulatory Visit | Attending: Cardiology | Admitting: Cardiology

## 2017-11-28 DIAGNOSIS — R0602 Shortness of breath: Secondary | ICD-10-CM | POA: Diagnosis not present

## 2017-11-28 DIAGNOSIS — I712 Thoracic aortic aneurysm, without rupture, unspecified: Secondary | ICD-10-CM

## 2017-11-28 MED ORDER — IOPAMIDOL (ISOVUE-370) INJECTION 76%
100.0000 mL | Freq: Once | INTRAVENOUS | Status: AC | PRN
  Administered 2017-11-28: 100 mL via INTRAVENOUS

## 2017-12-02 ENCOUNTER — Other Ambulatory Visit: Payer: Self-pay

## 2017-12-02 ENCOUNTER — Telehealth: Payer: Self-pay

## 2017-12-02 DIAGNOSIS — R0602 Shortness of breath: Secondary | ICD-10-CM

## 2017-12-02 NOTE — Telephone Encounter (Signed)
Spoke to patient chest ct results given.Stated he is still having sob at times.Spoke to [place] he advised lexiscan myoview.Scheduler will call back to schedule.

## 2017-12-15 ENCOUNTER — Telehealth (HOSPITAL_COMMUNITY): Payer: Self-pay

## 2017-12-15 DIAGNOSIS — H21233 Degeneration of iris (pigmentary), bilateral: Secondary | ICD-10-CM | POA: Diagnosis not present

## 2017-12-15 DIAGNOSIS — H40053 Ocular hypertension, bilateral: Secondary | ICD-10-CM | POA: Diagnosis not present

## 2017-12-15 DIAGNOSIS — Z961 Presence of intraocular lens: Secondary | ICD-10-CM | POA: Diagnosis not present

## 2017-12-15 DIAGNOSIS — H01004 Unspecified blepharitis left upper eyelid: Secondary | ICD-10-CM | POA: Diagnosis not present

## 2017-12-15 NOTE — Telephone Encounter (Signed)
Encounter complete. 

## 2017-12-20 ENCOUNTER — Ambulatory Visit (HOSPITAL_COMMUNITY)
Admission: RE | Admit: 2017-12-20 | Discharge: 2017-12-20 | Disposition: A | Discharge status: Home / Self Care | Payer: Medicare Other | Source: Ambulatory Visit | Attending: Internal Medicine | Admitting: Internal Medicine

## 2017-12-20 DIAGNOSIS — R0602 Shortness of breath: Secondary | ICD-10-CM | POA: Diagnosis not present

## 2017-12-20 LAB — MYOCARDIAL PERFUSION IMAGING
CHL CUP NUCLEAR SRS: 4
CHL CUP NUCLEAR SSS: 6
CSEPEW: 9.8 METS
CSEPPHR: 141 {beats}/min
Exercise duration (min): 7 min
Exercise duration (sec): 50 s
LV dias vol: 138 mL (ref 62–150)
LVSYSVOL: 64 mL
MPHR: 150 {beats}/min
NUC STRESS TID: 0.98
Percent HR: 94 %
RPE: 19
Rest HR: 62 {beats}/min
SDS: 2

## 2017-12-20 MED ORDER — TECHNETIUM TC 99M TETROFOSMIN IV KIT
10.8000 | PACK | Freq: Once | INTRAVENOUS | Status: AC | PRN
  Administered 2017-12-20: 10.8 via INTRAVENOUS
  Filled 2017-12-20: qty 11

## 2017-12-20 MED ORDER — TECHNETIUM TC 99M TETROFOSMIN IV KIT
32.2000 | PACK | Freq: Once | INTRAVENOUS | Status: AC | PRN
  Administered 2017-12-20: 32.2 via INTRAVENOUS
  Filled 2017-12-20: qty 33

## 2017-12-28 ENCOUNTER — Other Ambulatory Visit: Payer: Self-pay

## 2018-04-21 DIAGNOSIS — L814 Other melanin hyperpigmentation: Secondary | ICD-10-CM | POA: Diagnosis not present

## 2018-04-21 DIAGNOSIS — L570 Actinic keratosis: Secondary | ICD-10-CM | POA: Diagnosis not present

## 2018-04-21 DIAGNOSIS — D1801 Hemangioma of skin and subcutaneous tissue: Secondary | ICD-10-CM | POA: Diagnosis not present

## 2018-04-21 DIAGNOSIS — Z85828 Personal history of other malignant neoplasm of skin: Secondary | ICD-10-CM | POA: Diagnosis not present

## 2018-04-21 DIAGNOSIS — D225 Melanocytic nevi of trunk: Secondary | ICD-10-CM | POA: Diagnosis not present

## 2018-04-21 DIAGNOSIS — D485 Neoplasm of uncertain behavior of skin: Secondary | ICD-10-CM | POA: Diagnosis not present

## 2018-06-19 ENCOUNTER — Encounter: Payer: Self-pay | Admitting: Gastroenterology

## 2018-08-25 DIAGNOSIS — H1789 Other corneal scars and opacities: Secondary | ICD-10-CM | POA: Diagnosis not present

## 2018-08-25 DIAGNOSIS — H43813 Vitreous degeneration, bilateral: Secondary | ICD-10-CM | POA: Diagnosis not present

## 2018-08-25 DIAGNOSIS — H5213 Myopia, bilateral: Secondary | ICD-10-CM | POA: Diagnosis not present

## 2018-08-25 DIAGNOSIS — Z961 Presence of intraocular lens: Secondary | ICD-10-CM | POA: Diagnosis not present

## 2018-11-14 ENCOUNTER — Encounter: Payer: Self-pay | Admitting: Internal Medicine

## 2018-11-15 ENCOUNTER — Other Ambulatory Visit: Payer: Self-pay

## 2018-11-15 DIAGNOSIS — I712 Thoracic aortic aneurysm, without rupture, unspecified: Secondary | ICD-10-CM

## 2018-11-20 DIAGNOSIS — Z23 Encounter for immunization: Secondary | ICD-10-CM | POA: Diagnosis not present

## 2018-11-24 ENCOUNTER — Other Ambulatory Visit: Payer: Self-pay | Admitting: Cardiology

## 2018-11-24 DIAGNOSIS — I712 Thoracic aortic aneurysm, without rupture, unspecified: Secondary | ICD-10-CM

## 2018-12-14 ENCOUNTER — Other Ambulatory Visit: Payer: Self-pay

## 2018-12-14 DIAGNOSIS — I712 Thoracic aortic aneurysm, without rupture, unspecified: Secondary | ICD-10-CM

## 2019-01-08 ENCOUNTER — Other Ambulatory Visit: Payer: Self-pay

## 2019-01-09 ENCOUNTER — Ambulatory Visit (INDEPENDENT_AMBULATORY_CARE_PROVIDER_SITE_OTHER): Payer: Medicare Other | Admitting: Internal Medicine

## 2019-01-09 ENCOUNTER — Encounter: Payer: Self-pay | Admitting: Internal Medicine

## 2019-01-09 VITALS — BP 130/80 | HR 61 | Temp 97.6°F | Ht 71.5 in | Wt 188.4 lb

## 2019-01-09 DIAGNOSIS — R351 Nocturia: Secondary | ICD-10-CM

## 2019-01-09 DIAGNOSIS — G4733 Obstructive sleep apnea (adult) (pediatric): Secondary | ICD-10-CM

## 2019-01-09 DIAGNOSIS — M2041 Other hammer toe(s) (acquired), right foot: Secondary | ICD-10-CM

## 2019-01-09 DIAGNOSIS — N401 Enlarged prostate with lower urinary tract symptoms: Secondary | ICD-10-CM | POA: Diagnosis not present

## 2019-01-09 DIAGNOSIS — M2042 Other hammer toe(s) (acquired), left foot: Secondary | ICD-10-CM

## 2019-01-09 DIAGNOSIS — I10 Essential (primary) hypertension: Secondary | ICD-10-CM | POA: Diagnosis not present

## 2019-01-09 LAB — CBC WITH DIFFERENTIAL/PLATELET
Basophils Absolute: 0 10*3/uL (ref 0.0–0.1)
Basophils Relative: 0.4 % (ref 0.0–3.0)
Eosinophils Absolute: 0.1 10*3/uL (ref 0.0–0.7)
Eosinophils Relative: 2.3 % (ref 0.0–5.0)
HCT: 44.9 % (ref 39.0–52.0)
Hemoglobin: 15.1 g/dL (ref 13.0–17.0)
Lymphocytes Relative: 29.3 % (ref 12.0–46.0)
Lymphs Abs: 1.7 10*3/uL (ref 0.7–4.0)
MCHC: 33.7 g/dL (ref 30.0–36.0)
MCV: 87.1 fl (ref 78.0–100.0)
Monocytes Absolute: 0.5 10*3/uL (ref 0.1–1.0)
Monocytes Relative: 8.5 % (ref 3.0–12.0)
Neutro Abs: 3.3 10*3/uL (ref 1.4–7.7)
Neutrophils Relative %: 59.5 % (ref 43.0–77.0)
Platelets: 212 10*3/uL (ref 150.0–400.0)
RBC: 5.16 Mil/uL (ref 4.22–5.81)
RDW: 13 % (ref 11.5–15.5)
WBC: 5.6 10*3/uL (ref 4.0–10.5)

## 2019-01-09 LAB — COMPREHENSIVE METABOLIC PANEL
ALT: 14 U/L (ref 0–53)
AST: 17 U/L (ref 0–37)
Albumin: 4.6 g/dL (ref 3.5–5.2)
Alkaline Phosphatase: 55 U/L (ref 39–117)
BUN: 15 mg/dL (ref 6–23)
CO2: 29 mEq/L (ref 19–32)
Calcium: 9.5 mg/dL (ref 8.4–10.5)
Chloride: 102 mEq/L (ref 96–112)
Creatinine, Ser: 0.92 mg/dL (ref 0.40–1.50)
GFR: 80.87 mL/min (ref >60.00)
Glucose, Bld: 105 mg/dL — ABNORMAL HIGH (ref 70–99)
Potassium: 4.4 mEq/L (ref 3.5–5.1)
Sodium: 138 mEq/L (ref 135–145)
Total Bilirubin: 0.9 mg/dL (ref 0.2–1.2)
Total Protein: 7.4 g/dL (ref 6.0–8.3)

## 2019-01-09 LAB — LIPID PANEL
Cholesterol: 168 mg/dL (ref 0–200)
HDL: 34.8 mg/dL — ABNORMAL LOW (ref >39.00)
LDL Cholesterol: 96 mg/dL (ref 0–99)
NonHDL: 133.37
Total CHOL/HDL Ratio: 5
Triglycerides: 185 mg/dL — ABNORMAL HIGH (ref 0.0–149.0)
VLDL: 37 mg/dL (ref 0.0–40.0)

## 2019-01-09 NOTE — Progress Notes (Signed)
Established Patient Office Visit     This visit occurred during the SARS-CoV-2 public health emergency.  Safety protocols were in place, including screening questions prior to the visit, additional usage of staff PPE, and extensive cleaning of exam room while observing appropriate contact time as indicated for disinfecting solutions.    CC/Reason for Visit: Establish care, discuss chronic medical conditions and discuss an acute concern  HPI: Randall Kirby is a 73 y.o. male who is coming in today for the above mentioned reasons. Past Medical History is significant for: Hypertension that has been well controlled on lisinopril/hydrochlorothiazide.  He also has a history of obstructive sleep apnea but has not been using his CPAP machine as he has a significant air leak causes eye dryness and pain.  He states he is not fatigued and does not have frequent nighttime awakenings.  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  He is an occasional alcohol drinker, smoked for 3 years in his early 68s but none since, his family history is significant for a mother had diabetes and coronary artery disease he has allergies to penicillin but is unsure of the reaction, this is a childhood allergy, his past surgical history is significant for cholecystectomy and a back fusion in 1987 as well as cataract surgery 3 years ago.  He would like to show me some pain and blisters that he has been having in between his third and fourth toes of both feet.   Past Medical/Surgical History: Past Medical History:  Diagnosis Date   Hypertension    Sleep apnea     Past Surgical History:  Procedure Laterality Date   CHOLECYSTECTOMY     lumbar HNP     SKIN CANCER EXCISION     nose    Social History:  reports that he quit smoking about 52 years ago. His smoking use included cigarettes. He has a 3.00 pack-year smoking history. He has never used smokeless tobacco. He reports  current alcohol use. He reports that he does not use drugs.  Allergies: Allergies  Allergen Reactions   Penicillins Other (See Comments)    Unknown-told as child    Family History:  Family History  Problem Relation Age of Onset   Asthma Other    Heart disease Other    Heart Problems Mother    Colon cancer Neg Hx      Current Outpatient Medications:    aspirin 81 MG tablet, Take 81 mg by mouth daily.  , Disp: , Rfl:    lisinopril-hydrochlorothiazide (PRINZIDE,ZESTORETIC) 20-12.5 MG tablet, Take 0.5 tablets by mouth daily., Disp: 100 tablet, Rfl: 3  Review of Systems:  Constitutional: Denies fever, chills, diaphoresis, appetite change and fatigue.  HEENT: Denies photophobia, eye pain, redness, hearing loss, ear pain, congestion, sore throat, rhinorrhea, sneezing, mouth sores, trouble swallowing, neck pain, neck stiffness and tinnitus.   Respiratory: Denies SOB, DOE, cough, chest tightness,  and wheezing.   Cardiovascular: Denies chest pain, palpitations and leg swelling.  Gastrointestinal: Denies nausea, vomiting, abdominal pain, diarrhea, constipation, blood in stool and abdominal distention.  Genitourinary: Denies dysuria, urgency, frequency, hematuria, flank pain and difficulty urinating.  Endocrine: Denies: hot or cold intolerance, sweats, changes in hair or nails, polyuria, polydipsia. Musculoskeletal: Denies myalgias, back pain, joint swelling, arthralgias and gait problem.  Skin: Denies pallor, rash and wound.  Neurological: Denies dizziness, seizures, syncope, weakness, light-headedness, numbness and headaches.  Hematological: Denies adenopathy. Easy bruising, personal or family  bleeding history  Psychiatric/Behavioral: Denies suicidal ideation, mood changes, confusion, nervousness, sleep disturbance and agitation    Physical Exam: Vitals:   01/09/19 1058  BP: 130/80  Pulse: 61  Temp: 97.6 F (36.4 C)  TempSrc: Temporal  SpO2: 99%  Weight: 188 lb 6.4 oz  (85.5 kg)  Height: 5' 11.5" (1.816 m)    Body mass index is 25.91 kg/m.   Constitutional: NAD, calm, comfortable, wearing sunglasses Eyes: PERRL, lids and conjunctivae normal ENMT: Mucous membranes are moist.  Respiratory: clear to auscultation bilaterally, no wheezing, no crackles. Normal respiratory effort. No accessory muscle use.  Cardiovascular: Regular rate and rhythm, no murmurs / rubs / gallops. No extremity edema. 2+ pedal pulses. Abdomen: no tenderness, no masses palpated. No hepatosplenomegaly. Bowel sounds positive.  Musculoskeletal: no clubbing / cyanosis. No joint deformity upper and lower extremities. Good ROM, no contractures. Normal muscle tone.  Hammertoes of third and fourth toes bilaterally with blister formation in between. Skin: no rashes, lesions, ulcers. No induration Neurologic: Grossly intact and nonfocal  Psychiatric: Normal judgment and insight. Alert and oriented x 3. Normal mood.    Impression and Plan:  Essential hypertension  -Well-controlled, continue current medication.  Obstructive sleep apnea -Noncompliant with CPAP due to air leak, he is not interested in having machine refitted as he states he has no symptoms of OSA.  BPH associated with nocturia -Wakes up 2-3 times a night to urinate, not interested in seeing urology at this time.  Hammer toes of both feet -Blisters in between third and fourth toes of both feet are induced by hammertoes. -Have advised footwear with a wide toe box and he can also use moleskin and cushions in between these 2 toes.    Patient Instructions  -Nice seeing you today!!  -Lab work today; will notify you once results are available.  -Schedule follow up for your annual physical.     Lelon Frohlich, MD [facility]

## 2019-01-09 NOTE — Patient Instructions (Signed)
-  Nice seeing you today!!  -Lab work today; will notify you once results are available.  -Schedule follow up for your annual physical.

## 2019-01-12 ENCOUNTER — Inpatient Hospital Stay: Admission: RE | Admit: 2019-01-12 | Payer: Medicare Other | Source: Ambulatory Visit

## 2019-02-06 ENCOUNTER — Other Ambulatory Visit: Payer: Self-pay

## 2019-02-06 ENCOUNTER — Ambulatory Visit (HOSPITAL_COMMUNITY)
Admission: RE | Admit: 2019-02-06 | Discharge: 2019-02-06 | Disposition: A | Discharge status: Home / Self Care | Payer: Medicare Other | Source: Ambulatory Visit | Attending: Cardiology | Admitting: Cardiology

## 2019-02-06 DIAGNOSIS — I712 Thoracic aortic aneurysm, without rupture, unspecified: Secondary | ICD-10-CM

## 2019-02-06 MED ORDER — IOHEXOL 350 MG/ML SOLN
75.0000 mL | Freq: Once | INTRAVENOUS | Status: AC | PRN
  Administered 2019-02-06: 75 mL via INTRAVENOUS

## 2019-03-16 ENCOUNTER — Encounter: Payer: Self-pay | Admitting: Gastroenterology

## 2019-03-19 ENCOUNTER — Ambulatory Visit: Payer: Medicare Other | Attending: Internal Medicine

## 2019-03-19 DIAGNOSIS — Z23 Encounter for immunization: Secondary | ICD-10-CM | POA: Insufficient documentation

## 2019-03-19 NOTE — Progress Notes (Signed)
   Covid-19 Vaccination Clinic  Name:  Randall Kirby    MRN: [id number] DOB: [birth year]  03/19/2019  [name] was observed post Covid-19 immunization for 15 minutes without incidence. He was provided with Vaccine Information Sheet and instruction to access the V-Safe system.   [name] was instructed to call 911 with any severe reactions post vaccine: Marland Kitchen Difficulty breathing  . Swelling of your face and throat  . A fast heartbeat  . A bad rash all over your body  . Dizziness and weakness    Immunizations Administered    Name Date Dose VIS Date Route   Pfizer COVID-19 Vaccine 03/19/2019  8:35 AM 0.3 mL 01/19/2019 Intramuscular   Manufacturer: [place]   Lot: [id number]   [place]: [id number]

## 2019-04-06 ENCOUNTER — Ambulatory Visit: Payer: Medicare Other

## 2019-04-11 ENCOUNTER — Encounter: Payer: Medicare Other | Admitting: Internal Medicine

## 2019-04-11 ENCOUNTER — Ambulatory Visit: Payer: Medicare Other | Attending: Internal Medicine

## 2019-04-11 DIAGNOSIS — Z23 Encounter for immunization: Secondary | ICD-10-CM | POA: Insufficient documentation

## 2019-04-11 NOTE — Progress Notes (Signed)
   Covid-19 Vaccination Clinic  Name:  Randall Kirby    MRN: [id number] DOB: [birth year]  04/11/2019  [name] was observed post Covid-19 immunization for 15 minutes without incident. He was provided with Vaccine Information Sheet and instruction to access the V-Safe system.   [name] was instructed to call 911 with any severe reactions post vaccine: Marland Kitchen Difficulty breathing  . Swelling of face and throat  . A fast heartbeat  . A bad rash all over body  . Dizziness and weakness   Immunizations Administered    Name Date Dose VIS Date Route   Pfizer COVID-19 Vaccine 04/11/2019 11:08 AM 0.3 mL 01/19/2019 Intramuscular   Manufacturer: [place]   Lot: [id number]   [place]: [id number]

## 2019-04-16 ENCOUNTER — Ambulatory Visit (AMBULATORY_SURGERY_CENTER): Payer: Self-pay

## 2019-04-16 ENCOUNTER — Other Ambulatory Visit: Payer: Self-pay

## 2019-04-16 VITALS — Temp 97.1°F | Ht 71.5 in | Wt 192.0 lb

## 2019-04-16 DIAGNOSIS — Z86010 Personal history of colonic polyps: Secondary | ICD-10-CM

## 2019-04-16 NOTE — Progress Notes (Signed)

## 2019-04-20 ENCOUNTER — Other Ambulatory Visit: Payer: Self-pay | Admitting: *Deleted

## 2019-04-20 DIAGNOSIS — I10 Essential (primary) hypertension: Secondary | ICD-10-CM

## 2019-04-20 MED ORDER — LISINOPRIL-HYDROCHLOROTHIAZIDE 20-12.5 MG PO TABS: 0.5000 | ORAL_TABLET | Freq: Every day | ORAL | 1 refills | Status: DC

## 2019-04-30 ENCOUNTER — Encounter: Payer: Self-pay | Admitting: Gastroenterology

## 2019-04-30 ENCOUNTER — Ambulatory Visit (AMBULATORY_SURGERY_CENTER): Payer: Medicare Other | Admitting: Gastroenterology

## 2019-04-30 ENCOUNTER — Other Ambulatory Visit: Payer: Self-pay

## 2019-04-30 VITALS — BP 119/74 | HR 60 | Temp 96.8°F | Resp 11 | Ht 71.0 in | Wt 192.0 lb

## 2019-04-30 DIAGNOSIS — Z86010 Personal history of colonic polyps: Secondary | ICD-10-CM | POA: Diagnosis not present

## 2019-04-30 MED ORDER — SODIUM CHLORIDE 0.9 % IV SOLN: 500.0000 mL | Freq: Once | INTRAVENOUS | Status: DC

## 2019-04-30 NOTE — Progress Notes (Signed)
A and O x3. Report to RN. Tolerated MAC anesthesia well.

## 2019-04-30 NOTE — Patient Instructions (Addendum)
Impression/Recommendations:  Diverticulosis handout given to patient. Hemorrhoid handout given to patient. High fiber diet handout given to patient.  No repeat colonoscopy due to age and absence of colonic polyps.  Continue present medications.  YOU HAD AN ENDOSCOPIC PROCEDURE TODAY AT [facility]:   Refer to the procedure report that was given to you for any specific questions about what was found during the examination.  If the procedure report does not answer your questions, please call your gastroenterologist to clarify.  If you requested that your care partner not be given the details of your procedure findings, then the procedure report has been included in a sealed envelope for you to review at your convenience later.  YOU SHOULD EXPECT: Some feelings of bloating in the abdomen. Passage of more gas than usual.  Walking can help get rid of the air that was put into your GI tract during the procedure and reduce the bloating. If you had a lower endoscopy (such as a colonoscopy or flexible sigmoidoscopy) you may notice spotting of blood in your stool or on the toilet paper. If you underwent a bowel prep for your procedure, you may not have a normal bowel movement for a few days.  Please Note:  You might notice some irritation and congestion in your nose or some drainage.  This is from the oxygen used during your procedure.  There is no need for concern and it should clear up in a day or so.  SYMPTOMS TO REPORT IMMEDIATELY:   Following lower endoscopy (colonoscopy or flexible sigmoidoscopy):  Excessive amounts of blood in the stool  Significant tenderness or worsening of abdominal pains  Swelling of the abdomen that is new, acute  Fever of 100F or higher For urgent or emergent issues, a gastroenterologist can be reached at any hour by calling [phone]. Do not use MyChart messaging for urgent concerns.    DIET:  We do recommend a small meal at first, but then you  may proceed to your regular diet.  Drink plenty of fluids but you should avoid alcoholic beverages for 24 hours.  ACTIVITY:  You should plan to take it easy for the rest of today and you should NOT DRIVE or use heavy machinery until tomorrow (because of the sedation medicines used during the test).    FOLLOW UP: Our staff will call the number listed on your records 48-72 hours following your procedure to check on you and address any questions or concerns that you may have regarding the information given to you following your procedure. If we do not reach you, we will leave a message.  We will attempt to reach you two times.  During this call, we will ask if you have developed any symptoms of COVID 19. If you develop any symptoms (ie: fever, flu-like symptoms, shortness of breath, cough etc.) before then, please call [phone].  If you test positive for Covid 19 in the 2 weeks post procedure, please call and report this information to [place].    If any biopsies were taken you will be contacted by phone or by letter within the next 1-3 weeks.  Please call us at [phone] if you have not heard about the biopsies in 3 weeks.    SIGNATURES/CONFIDENTIALITY: You and/or your care partner have signed paperwork which will be entered into your electronic medical record.  These signatures attest to the fact that that the information above on your After Visit Summary has been reviewed and is understood.  Full responsibility of the confidentiality of this discharge information lies with you and/or your care-partner.

## 2019-04-30 NOTE — Progress Notes (Signed)
Temp check by:JB Vital check by:KA  The patient states no changes in medical or surgical history since pre-visit screening on 04/16/19.

## 2019-04-30 NOTE — Op Note (Addendum)
[place] Patient Name: Randall Kirby Procedure Date: 04/30/2019 11:18 AM MRN: [id number] Endoscopist: Ladene Artist , MD Age: 74 Referring MD:  Date of Birth: 03/18/1945 Gender: Male Account #: [id number] Procedure:                Colonoscopy Indications:              Surveillance: Personal history of adenomatous                            polyps on last colonoscopy > 5 years ago Medicines:                Monitored Anesthesia Care Procedure:                Pre-Anesthesia Assessment:                           - Prior to the procedure, a History and Physical                            was performed, and patient medications and                            allergies were reviewed. The patient's tolerance of                            previous anesthesia was also reviewed. The risks                            and benefits of the procedure and the sedation                            options and risks were discussed with the patient.                            All questions were answered, and informed consent                            was obtained. Prior Anticoagulants: The patient has                            taken no previous anticoagulant or antiplatelet                            agents. ASA Grade Assessment: II - A patient with                            mild systemic disease. After reviewing the risks                            and benefits, the patient was deemed in                            satisfactory condition to undergo the procedure.  After obtaining informed consent, the colonoscope                            was passed under direct vision. Throughout the                            procedure, the patient's blood pressure, pulse, and                            oxygen saturations were monitored continuously. The                            Colonoscope was introduced through the anus and                            advanced to the the  cecum, identified by                            appendiceal orifice and ileocecal valve. The                            ileocecal valve, appendiceal orifice, and rectum                            were photographed. The quality of the bowel                            preparation was good. The colonoscopy was performed                            without difficulty. The patient tolerated the                            procedure well. Scope In: 11:25:11 AM Scope Out: 11:39:28 AM Scope Withdrawal Time: 0 hours 10 minutes 17 seconds  Total Procedure Duration: 0 hours 14 minutes 17 seconds  Findings:                 The perianal and digital rectal examinations were                            normal.                           Multiple medium-mouthed diverticula were found in                            the left colon. There was evidence of diverticular                            spasm. There was evidence of an impacted                            diverticulum. There was no evidence of diverticular  bleeding.                           Internal hemorrhoids were found during                            retroflexion. The hemorrhoids were small and Grade                            I (internal hemorrhoids that do not prolapse).                           The exam was otherwise without abnormality on                            direct and retroflexion views. Complications:            No immediate complications. Estimated blood loss:                            None. Estimated Blood Loss:     Estimated blood loss: none. Impression:               - Moderate diverticulosis in the left colon.                           - Internal hemorrhoids.                           - The examination was otherwise normal on direct                            and retroflexion views.                           - No specimens collected. Recommendation:           - Patient has a contact number available  for                            emergencies. The signs and symptoms of potential                            delayed complications were discussed with the                            patient. Return to normal activities tomorrow.                            Written discharge instructions were provided to the                            patient.                           - High fiber diet.                           -  Continue present medications.                           - No repeat colonoscopy due to age and the absence                            of colonic polyps. Ladene Artist, MD 04/30/2019 11:42:46 AM This report has been signed electronically.

## 2019-05-02 ENCOUNTER — Telehealth: Payer: Self-pay | Admitting: *Deleted

## 2019-05-02 NOTE — Telephone Encounter (Signed)
  Follow up Call-  Call back number 04/30/2019  Post procedure Call Back phone  # [phone]  Permission to leave phone message Yes  Some recent data might be hidden     Patient questions:  Do you have a fever, pain , or abdominal swelling? No. Pain Score  0 *  Have you tolerated food without any problems? Yes.    Have you been able to return to your normal activities? Yes.    Do you have any questions about your discharge instructions: Diet   No. Medications  No. Follow up visit  No.  Do you have questions or concerns about your Care? No.  Actions: * If pain score is 4 or above: No action needed, pain <4.  Information provided via wife.   1. Have you developed a fever since your procedure? no  2.   Have you had an respiratory symptoms (SOB or cough) since your procedure? no  3.   Have you tested positive for COVID 19 since your procedure no  4.   Have you had any family members/close contacts diagnosed with the COVID 19 since your procedure?  no   If yes to any of these questions please route to Joylene John, RN and Erenest Rasher, RN

## 2019-05-18 ENCOUNTER — Telehealth: Payer: Self-pay | Admitting: Internal Medicine

## 2019-05-18 NOTE — Telephone Encounter (Signed)
Pt wants to transfer care to Dr Ethelene Hal, Is this ok?

## 2019-05-21 NOTE — Telephone Encounter (Signed)
Randall Kirby, That would be okay.

## 2019-05-21 NOTE — Telephone Encounter (Signed)
Appt is scheduled

## 2019-06-11 ENCOUNTER — Other Ambulatory Visit: Payer: Self-pay

## 2019-06-12 ENCOUNTER — Encounter: Payer: Medicare Other | Admitting: Family Medicine

## 2019-06-12 ENCOUNTER — Encounter: Payer: Self-pay | Admitting: Family Medicine

## 2019-06-12 ENCOUNTER — Ambulatory Visit (INDEPENDENT_AMBULATORY_CARE_PROVIDER_SITE_OTHER): Payer: Medicare Other | Admitting: Family Medicine

## 2019-06-12 VITALS — BP 140/76 | HR 68 | Temp 98.0°F | Ht 71.0 in | Wt 190.0 lb

## 2019-06-12 DIAGNOSIS — Z0000 Encounter for general adult medical examination without abnormal findings: Secondary | ICD-10-CM

## 2019-06-12 DIAGNOSIS — I10 Essential (primary) hypertension: Secondary | ICD-10-CM

## 2019-06-12 DIAGNOSIS — N401 Enlarged prostate with lower urinary tract symptoms: Secondary | ICD-10-CM

## 2019-06-12 DIAGNOSIS — R351 Nocturia: Secondary | ICD-10-CM

## 2019-06-12 DIAGNOSIS — E782 Mixed hyperlipidemia: Secondary | ICD-10-CM | POA: Insufficient documentation

## 2019-06-12 LAB — CBC
HCT: 42.2 % (ref 39.0–52.0)
Hemoglobin: 14 g/dL (ref 13.0–17.0)
MCHC: 33.3 g/dL (ref 30.0–36.0)
MCV: 87.5 fl (ref 78.0–100.0)
Platelets: 191 10*3/uL (ref 150.0–400.0)
RBC: 4.82 Mil/uL (ref 4.22–5.81)
RDW: 13 % (ref 11.5–15.5)
WBC: 4.3 10*3/uL (ref 4.0–10.5)

## 2019-06-12 LAB — COMPREHENSIVE METABOLIC PANEL
ALT: 14 U/L (ref 0–53)
AST: 17 U/L (ref 0–37)
Albumin: 4.3 g/dL (ref 3.5–5.2)
Alkaline Phosphatase: 47 U/L (ref 39–117)
BUN: 14 mg/dL (ref 6–23)
CO2: 29 mEq/L (ref 19–32)
Calcium: 8.7 mg/dL (ref 8.4–10.5)
Chloride: 103 mEq/L (ref 96–112)
Creatinine, Ser: 0.96 mg/dL (ref 0.40–1.50)
GFR: 76.9 mL/min (ref >60.00)
Glucose, Bld: 104 mg/dL — ABNORMAL HIGH (ref 70–99)
Potassium: 3.8 mEq/L (ref 3.5–5.1)
Sodium: 137 mEq/L (ref 135–145)
Total Bilirubin: 0.8 mg/dL (ref 0.2–1.2)
Total Protein: 6.8 g/dL (ref 6.0–8.3)

## 2019-06-12 LAB — URINALYSIS, ROUTINE W REFLEX MICROSCOPIC
Bilirubin Urine: NEGATIVE
Ketones, ur: NEGATIVE
Leukocytes,Ua: NEGATIVE
Nitrite: NEGATIVE
RBC / HPF: NONE SEEN (ref 0–?)
Specific Gravity, Urine: 1.02 (ref 1.000–1.030)
Total Protein, Urine: NEGATIVE
Urine Glucose: NEGATIVE
Urobilinogen, UA: 0.2 (ref 0.0–1.0)
pH: 6.5 (ref 5.0–8.0)

## 2019-06-12 LAB — PSA: PSA: 1.15 ng/mL (ref 0.10–4.00)

## 2019-06-12 LAB — LIPID PANEL
Cholesterol: 143 mg/dL (ref 0–200)
HDL: 31.5 mg/dL — ABNORMAL LOW (ref >39.00)
LDL Cholesterol: 82 mg/dL (ref 0–99)
NonHDL: 111.97
Total CHOL/HDL Ratio: 5
Triglycerides: 149 mg/dL (ref 0.0–149.0)
VLDL: 29.8 mg/dL (ref 0.0–40.0)

## 2019-06-12 NOTE — Progress Notes (Signed)
Established Patient Office Visit  Subjective:  Patient ID: Randall Kirby, male    DOB: [birth year]  Age: 74 y.o. MRN: [id number]  CC:  Chief Complaint  Patient presents with  . Transitions Of Care    annual exam/TOC from Dr. Jerilee Hoh, no concerns    HPI [name] presents for establishment of care by way of transfer and follow-up for his hypertension, BPH symptoms elevated triglycerides.  Continues to lead an active lifestyle.  Blood pressures been well controlled on his current therapy.  He did not take his blood pressure medicine this morning for the exam today.  BP typically runs in the 120/70 range.  Does experience nocturia 2-3 times a night when he has been working light.  He does drink fluids right up until bedtime.  Rarely drinks alcohol.  Quit smoking many years ago.  He does not use illicit drugs.  Past Medical History:  Diagnosis Date  . Cataract    per pt  . Hypertension   . Sleep apnea     Past Surgical History:  Procedure Laterality Date  . CATARACT EXTRACTION, BILATERAL Bilateral   . CHOLECYSTECTOMY    . COLONOSCOPY  05/31/2013  . lumbar HNP    . SKIN CANCER EXCISION     nose    Family History  Problem Relation Age of Onset  . Asthma Other   . Heart disease Other   . Heart Problems Mother   . Colon cancer Neg Hx   . Colon polyps Neg Hx   . Esophageal cancer Neg Hx   . Rectal cancer Neg Hx   . Stomach cancer Neg Hx     Social History   Socioeconomic History  . Marital status: Married    Spouse name: Not on file  . Number of children: Not on file  . Years of education: Not on file  . Highest education level: Not on file  Occupational History  . Not on file  Tobacco Use  . Smoking status: Former Smoker    Packs/day: 1.00    Years: 3.00    Pack years: 3.00    Types: Cigarettes    Quit date: 02/08/1966    Years since quitting: 53.3  . Smokeless tobacco: Never Used  Substance and Sexual Activity  . Alcohol use: Yes    Comment:  ocassional beer/rare 1-2 beers per year per patient/Rm  . Drug use: No  . Sexual activity: Not on file  Other Topics Concern  . Not on file  Social History Narrative  . Not on file   Social Determinants of Health   Financial Resource Strain:   . Difficulty of Paying Living Expenses:   Food Insecurity:   . Worried About Charity fundraiser in the Last Year:   . Arboriculturist in the Last Year:   Transportation Needs:   . Film/video editor (Medical):   Marland Kitchen Lack of Transportation (Non-Medical):   Physical Activity:   . Days of Exercise per Week:   . Minutes of Exercise per Session:   Stress:   . Feeling of Stress :   Social Connections:   . Frequency of Communication with Friends and Family:   . Frequency of Social Gatherings with Friends and Family:   . Attends Religious Services:   . Active Member of Clubs or Organizations:   . Attends Archivist Meetings:   Marland Kitchen Marital Status:   Intimate Partner Violence:   . Fear of Current or  Ex-Partner:   . Emotionally Abused:   Marland Kitchen Physically Abused:   . Sexually Abused:     Outpatient Medications Prior to Visit  Medication Sig Dispense Refill  . aspirin 81 MG tablet Take 81 mg by mouth daily.      Marland Kitchen lisinopril-hydrochlorothiazide (ZESTORETIC) 20-12.5 MG tablet Take 0.5 tablets by mouth daily. 90 tablet 1   No facility-administered medications prior to visit.    Allergies  Allergen Reactions  . Penicillins Other (See Comments)    Unknown-told as child    ROS Review of Systems  Constitutional: Negative.   HENT: Negative.   Eyes: Negative for photophobia and visual disturbance.  Respiratory: Negative.   Cardiovascular: Negative.   Gastrointestinal: Negative.   Endocrine: Negative for polyphagia and polyuria.  Genitourinary: Negative for difficulty urinating, frequency and urgency.  Musculoskeletal: Negative for gait problem and joint swelling.  Skin: Negative for pallor.  Allergic/Immunologic: Negative for  immunocompromised state.  Neurological: Negative for light-headedness and numbness.  Hematological: Does not bruise/bleed easily.  Psychiatric/Behavioral: Negative.       Objective:    Physical Exam  Constitutional: He is oriented to person, place, and time. He appears well-developed and well-nourished. No distress.  HENT:  Head: Normocephalic and atraumatic.  Right Ear: External ear normal.  Left Ear: External ear normal.  Eyes: Conjunctivae are normal. Right eye exhibits no discharge. Left eye exhibits no discharge. No scleral icterus.  Neck: No JVD present. No tracheal deviation present.  Cardiovascular: Normal rate, regular rhythm and normal heart sounds.  Pulmonary/Chest: Effort normal and breath sounds normal. No stridor.  Genitourinary: Rectum:     Guaiac result negative.     External hemorrhoid present.     No rectal mass, anal fissure, tenderness, internal hemorrhoid or abnormal anal tone.  Prostate is enlarged. Prostate is not tender.  Musculoskeletal:        General: No edema.  Neurological: He is alert and oriented to person, place, and time.  Skin: Skin is warm and dry. He is not diaphoretic.  Psychiatric: He has a normal mood and affect. His behavior is normal.    BP 140/76   Pulse 68   Temp 98 F (36.7 C) (Tympanic)   Ht 5\' 11"  (1.803 m)   Wt 190 lb (86.2 kg)   SpO2 98%   BMI 26.50 kg/m  Wt Readings from Last 3 Encounters:  06/12/19 190 lb (86.2 kg)  04/30/19 192 lb (87.1 kg)  04/16/19 192 lb (87.1 kg)     Health Maintenance Due  Topic Date Due  . Hepatitis C Screening  Never done    There are no preventive care reminders to display for this patient.  Lab Results  Component Value Date   TSH 1.41 10/24/2017   Lab Results  Component Value Date   WBC 5.6 01/09/2019   HGB 15.1 01/09/2019   HCT 44.9 01/09/2019   MCV 87.1 01/09/2019   PLT 212.0 01/09/2019   Lab Results  Component Value Date   NA 138 01/09/2019   K 4.4 01/09/2019   CO2 29  01/09/2019   GLUCOSE 105 (H) 01/09/2019   BUN 15 01/09/2019   CREATININE 0.92 01/09/2019   BILITOT 0.9 01/09/2019   ALKPHOS 55 01/09/2019   AST 17 01/09/2019   ALT 14 01/09/2019   PROT 7.4 01/09/2019   ALBUMIN 4.6 01/09/2019   CALCIUM 9.5 01/09/2019   GFR 80.87 01/09/2019   Lab Results  Component Value Date   CHOL 168 01/09/2019   Lab  Results  Component Value Date   HDL 34.80 (L) 01/09/2019   Lab Results  Component Value Date   LDLCALC 96 01/09/2019   Lab Results  Component Value Date   TRIG 185.0 (H) 01/09/2019   Lab Results  Component Value Date   CHOLHDL 5 01/09/2019   Lab Results  Component Value Date   HGBA1C 5.8 05/16/2008      Assessment & Plan:   Problem List Items Addressed This Visit      Cardiovascular and Mediastinum   Essential hypertension - Primary   Relevant Orders   CBC   Comprehensive metabolic panel   Urinalysis, Routine w reflex microscopic     Other   BPH associated with nocturia   Relevant Orders   PSA   Elevated triglycerides with high cholesterol   Relevant Orders   Comprehensive metabolic panel   Lipid panel    Other Visit Diagnoses    Healthcare maintenance          No orders of the defined types were placed in this encounter.   Follow-up: Return in about 6 months (around 12/13/2019).  Given information on health maintenance and disease prevention as well as BPH.  Advised to avoid fluids 2 to 3 hours before retiring.  Discussed the possibility of medical therapy for his BPH symptoms and he declines that for now. Libby Maw, MD

## 2019-06-12 NOTE — Patient Instructions (Signed)
Health Maintenance After Age 73 After age 39, you are at a higher risk for certain long-term diseases and infections as well as injuries from falls. Falls are a major cause of broken bones and head injuries in people who are older than age 66. Getting regular preventive care can help to keep you healthy and well. Preventive care includes getting regular testing and making lifestyle changes as recommended by your health care provider. Talk with your health care provider about:  Which screenings and tests you should have. A screening is a test that checks for a disease when you have no symptoms.  A diet and exercise plan that is right for you. What should I know about screenings and tests to prevent falls? Screening and testing are the best ways to find a health problem early. Early diagnosis and treatment give you the best chance of managing medical conditions that are common after age 8. Certain conditions and lifestyle choices may make you more likely to have a fall. Your health care provider may recommend:  Regular vision checks. Poor vision and conditions such as cataracts can make you more likely to have a fall. If you wear glasses, make sure to get your prescription updated if your vision changes.  Medicine review. Work with your health care provider to regularly review all of the medicines you are taking, including over-the-counter medicines. Ask your health care provider about any side effects that may make you more likely to have a fall. Tell your health care provider if any medicines that you take make you feel dizzy or sleepy.  Osteoporosis screening. Osteoporosis is a condition that causes the bones to get weaker. This can make the bones weak and cause them to break more easily.  Blood pressure screening. Blood pressure changes and medicines to control blood pressure can make you feel dizzy.  Strength and balance checks. Your health care provider may recommend certain tests to check your  strength and balance while standing, walking, or changing positions.  Foot health exam. Foot pain and numbness, as well as not wearing proper footwear, can make you more likely to have a fall.  Depression screening. You may be more likely to have a fall if you have a fear of falling, feel emotionally low, or feel unable to do activities that you used to do.  Alcohol use screening. Using too much alcohol can affect your balance and may make you more likely to have a fall. What actions can I take to lower my risk of falls? General instructions  Talk with your health care provider about your risks for falling. Tell your health care provider if: ? You fall. Be sure to tell your health care provider about all falls, even ones that seem minor. ? You feel dizzy, sleepy, or off-balance.  Take over-the-counter and prescription medicines only as told by your health care provider. These include any supplements.  Eat a healthy diet and maintain a healthy weight. A healthy diet includes low-fat dairy products, low-fat (lean) meats, and fiber from whole grains, beans, and lots of fruits and vegetables. Home safety  Remove any tripping hazards, such as rugs, cords, and clutter.  Install safety equipment such as grab bars in bathrooms and safety rails on stairs.  Keep rooms and walkways well-lit. Activity   Follow a regular exercise program to stay fit. This will help you maintain your balance. Ask your health care provider what types of exercise are appropriate for you.  If you need a cane or  walker, use it as recommended by your health care provider.  Wear supportive shoes that have nonskid soles. Lifestyle  Do not drink alcohol if your health care provider tells you not to drink.  If you drink alcohol, limit how much you have: ? 0-1 drink a day for women. ? 0-2 drinks a day for men.  Be aware of how much alcohol is in your drink. In the [place] one drink equals one typical bottle of beer (12  oz), one-half glass of wine (5 oz), or one shot of hard liquor (1 oz).  Do not use any products that contain nicotine or tobacco, such as cigarettes and e-cigarettes. If you need help quitting, ask your health care provider. Summary  Having a healthy lifestyle and getting preventive care can help to protect your health and wellness after age 82.  Screening and testing are the best way to find a health problem early and help you avoid having a fall. Early diagnosis and treatment give you the best chance for managing medical conditions that are more common for people who are older than age 29.  Falls are a major cause of broken bones and head injuries in people who are older than age 20. Take precautions to prevent a fall at home.  Work with your health care provider to learn what changes you can make to improve your health and wellness and to prevent falls. This information is not intended to replace advice given to you by your health care provider. Make sure you discuss any questions you have with your health care provider. Document Revised: 05/18/2018 Document Reviewed: 12/08/2016 Elsevier Patient Education  2020 [place] 65 Years and Older, Male Preventive care refers to lifestyle choices and visits with your health care provider that can promote health and wellness. This includes:  A yearly physical exam. This is also called an annual well check.  Regular dental and eye exams.  Immunizations.  Screening for certain conditions.  Healthy lifestyle choices, such as diet and exercise. What can I expect for my preventive care visit? Physical exam Your health care provider will check:  Height and weight. These may be used to calculate body mass index (BMI), which is a measurement that tells if you are at a healthy weight.  Heart rate and blood pressure.  Your skin for abnormal spots. Counseling Your health care provider may ask you questions  about:  Alcohol, tobacco, and drug use.  Emotional well-being.  Home and relationship well-being.  Sexual activity.  Eating habits.  History of falls.  Memory and ability to understand (cognition).  Work and work Statistician. What immunizations do I need?  Influenza (flu) vaccine  This is recommended every year. Tetanus, diphtheria, and pertussis (Tdap) vaccine  You may need a Td booster every 10 years. Varicella (chickenpox) vaccine  You may need this vaccine if you have not already been vaccinated. Zoster (shingles) vaccine  You may need this after age 31. Pneumococcal conjugate (PCV13) vaccine  One dose is recommended after age 45. Pneumococcal polysaccharide (PPSV23) vaccine  One dose is recommended after age 42. Measles, mumps, and rubella (MMR) vaccine  You may need at least one dose of MMR if you were born in 1957 or later. You may also need a second dose. Meningococcal conjugate (MenACWY) vaccine  You may need this if you have certain conditions. Hepatitis A vaccine  You may need this if you have certain conditions or if you travel or work in places where  you may be exposed to hepatitis A. Hepatitis B vaccine  You may need this if you have certain conditions or if you travel or work in places where you may be exposed to hepatitis B. Haemophilus influenzae type b (Hib) vaccine  You may need this if you have certain conditions. You may receive vaccines as individual doses or as more than one vaccine together in one shot (combination vaccines). Talk with your health care provider about the risks and benefits of combination vaccines. What tests do I need? Blood tests  Lipid and cholesterol levels. These may be checked every 5 years, or more frequently depending on your overall health.  Hepatitis C test.  Hepatitis B test. Screening  Lung cancer screening. You may have this screening every year starting at age 67 if you have a 30-pack-year history of  smoking and currently smoke or have quit within the past 15 years.  Colorectal cancer screening. All adults should have this screening starting at age 57 and continuing until age 23. Your health care provider may recommend screening at age 71 if you are at increased risk. You will have tests every 1-10 years, depending on your results and the type of screening test.  Prostate cancer screening. Recommendations will vary depending on your family history and other risks.  Diabetes screening. This is done by checking your blood sugar (glucose) after you have not eaten for a while (fasting). You may have this done every 1-3 years.  Abdominal aortic aneurysm (AAA) screening. You may need this if you are a current or former smoker.  Sexually transmitted disease (STD) testing. Follow these instructions at home: Eating and drinking  Eat a diet that includes fresh fruits and vegetables, whole grains, lean protein, and low-fat dairy products. Limit your intake of foods with high amounts of sugar, saturated fats, and salt.  Take vitamin and mineral supplements as recommended by your health care provider.  Do not drink alcohol if your health care provider tells you not to drink.  If you drink alcohol: ? Limit how much you have to 0-2 drinks a day. ? Be aware of how much alcohol is in your drink. In the [place] one drink equals one 12 oz bottle of beer (355 mL), one 5 oz glass of wine (148 mL), or one 1 oz glass of hard liquor (44 mL). Lifestyle  Take daily care of your teeth and gums.  Stay active. Exercise for at least 30 minutes on 5 or more days each week.  Do not use any products that contain nicotine or tobacco, such as cigarettes, e-cigarettes, and chewing tobacco. If you need help quitting, ask your health care provider.  If you are sexually active, practice safe sex. Use a condom or other form of protection to prevent STIs (sexually transmitted infections).  Talk with your health care  provider about taking a low-dose aspirin or statin. What's next?  Visit your health care provider once a year for a well check visit.  Ask your health care provider how often you should have your eyes and teeth checked.  Stay up to date on all vaccines. This information is not intended to replace advice given to you by your health care provider. Make sure you discuss any questions you have with your health care provider. Document Revised: 01/19/2018 Document Reviewed: 01/19/2018 Elsevier Patient Education  2020 [employer].  Managing Your Hypertension Hypertension is commonly called high blood pressure. This is when the force of your blood pressing against the walls  of your arteries is too strong. Arteries are blood vessels that carry blood from your heart throughout your body. Hypertension forces the heart to work harder to pump blood, and may cause the arteries to become narrow or stiff. Having untreated or uncontrolled hypertension can cause heart attack, stroke, kidney disease, and other problems. What are blood pressure readings? A blood pressure reading consists of a higher number over a lower number. Ideally, your blood pressure should be below 120/80. The first ("top") number is called the systolic pressure. It is a measure of the pressure in your arteries as your heart beats. The second ("bottom") number is called the diastolic pressure. It is a measure of the pressure in your arteries as the heart relaxes. What does my blood pressure reading mean? Blood pressure is classified into four stages. Based on your blood pressure reading, your health care provider may use the following stages to determine what type of treatment you need, if any. Systolic pressure and diastolic pressure are measured in a unit called mm Hg. Normal  Systolic pressure: below 086.  Diastolic pressure: below 80. Elevated  Systolic pressure: 761-950.  Diastolic pressure: below 80. Hypertension stage  1  Systolic pressure: 932-671.  Diastolic pressure: 24-58. Hypertension stage 2  Systolic pressure: 099 or above.  Diastolic pressure: 90 or above. What health risks are associated with hypertension? Managing your hypertension is an important responsibility. Uncontrolled hypertension can lead to:  A heart attack.  A stroke.  A weakened blood vessel (aneurysm).  Heart failure.  Kidney damage.  Eye damage.  Metabolic syndrome.  Memory and concentration problems. What changes can I make to manage my hypertension? Hypertension can be managed by making lifestyle changes and possibly by taking medicines. Your health care provider will help you make a plan to bring your blood pressure within a normal range. Eating and drinking   Eat a diet that is high in fiber and potassium, and low in salt (sodium), added sugar, and fat. An example eating plan is called the DASH (Dietary Approaches to Stop Hypertension) diet. To eat this way: ? Eat plenty of fresh fruits and vegetables. Try to fill half of your plate at each meal with fruits and vegetables. ? Eat whole grains, such as whole wheat pasta, brown rice, or whole grain bread. Fill about one quarter of your plate with whole grains. ? Eat low-fat diary products. ? Avoid fatty cuts of meat, processed or cured meats, and poultry with skin. Fill about one quarter of your plate with lean proteins such as fish, chicken without skin, beans, eggs, and tofu. ? Avoid premade and processed foods. These tend to be higher in sodium, added sugar, and fat.  Reduce your daily sodium intake. Most people with hypertension should eat less than 1,500 mg of sodium a day.  Limit alcohol intake to no more than 1 drink a day for nonpregnant women and 2 drinks a day for men. One drink equals 12 oz of beer, 5 oz of wine, or 1 oz of hard liquor. Lifestyle  Work with your health care provider to maintain a healthy body weight, or to lose weight. Ask what an  ideal weight is for you.  Get at least 30 minutes of exercise that causes your heart to beat faster (aerobic exercise) most days of the week. Activities may include walking, swimming, or biking.  Include exercise to strengthen your muscles (resistance exercise), such as weight lifting, as part of your weekly exercise routine. Try to do these  types of exercises for 30 minutes at least 3 days a week.  Do not use any products that contain nicotine or tobacco, such as cigarettes and e-cigarettes. If you need help quitting, ask your health care provider.  Control any long-term (chronic) conditions you have, such as high cholesterol or diabetes. Monitoring  Monitor your blood pressure at home as told by your health care provider. Your personal target blood pressure may vary depending on your medical conditions, your age, and other factors.  Have your blood pressure checked regularly, as often as told by your health care provider. Working with your health care provider  Review all the medicines you take with your health care provider because there may be side effects or interactions.  Talk with your health care provider about your diet, exercise habits, and other lifestyle factors that may be contributing to hypertension.  Visit your health care provider regularly. Your health care provider can help you create and adjust your plan for managing hypertension. Will I need medicine to control my blood pressure? Your health care provider may prescribe medicine if lifestyle changes are not enough to get your blood pressure under control, and if:  Your systolic blood pressure is 130 or higher.  Your diastolic blood pressure is 80 or higher. Take medicines only as told by your health care provider. Follow the directions carefully. Blood pressure medicines must be taken as prescribed. The medicine does not work as well when you skip doses. Skipping doses also puts you at risk for problems. Contact a  health care provider if:  You think you are having a reaction to medicines you have taken.  You have repeated (recurrent) headaches.  You feel dizzy.  You have swelling in your ankles.  You have trouble with your vision. Get help right away if:  You develop a severe headache or confusion.  You have unusual weakness or numbness, or you feel faint.  You have severe pain in your chest or abdomen.  You vomit repeatedly.  You have trouble breathing. Summary  Hypertension is when the force of blood pumping through your arteries is too strong. If this condition is not controlled, it may put you at risk for serious complications.  Your personal target blood pressure may vary depending on your medical conditions, your age, and other factors. For most people, a normal blood pressure is less than 120/80.  Hypertension is managed by lifestyle changes, medicines, or both. Lifestyle changes include weight loss, eating a healthy, low-sodium diet, exercising more, and limiting alcohol. This information is not intended to replace advice given to you by your health care provider. Make sure you discuss any questions you have with your health care provider. Document Revised: 05/19/2018 Document Reviewed: 12/24/2015 Elsevier Patient Education  [place].  Benign Prostatic Hyperplasia  Benign prostatic hyperplasia (BPH) is an enlarged prostate gland that is caused by the normal aging process and not by cancer. The prostate is a walnut-sized gland that is involved in the production of semen. It is located in front of the rectum and below the bladder. The bladder stores urine and the urethra is the tube that carries the urine out of the body. The prostate may get bigger as a man gets older. An enlarged prostate can press on the urethra. This can make it harder to pass urine. The build-up of urine in the bladder can cause infection. Back pressure and infection may progress to bladder damage and  kidney (renal) failure. What are the causes? This  condition is part of a normal aging process. However, not all men develop problems from this condition. If the prostate enlarges away from the urethra, urine flow will not be blocked. If it enlarges toward the urethra and compresses it, there will be problems passing urine. What increases the risk? This condition is more likely to develop in men over the age of 70 years. What are the signs or symptoms? Symptoms of this condition include:  Getting up often during the night to urinate.  Needing to urinate frequently during the day.  Difficulty starting urine flow.  Decrease in size and strength of your urine stream.  Leaking (dribbling) after urinating.  Inability to pass urine. This needs immediate treatment.  Inability to completely empty your bladder.  Pain when you pass urine. This is more common if there is also an infection.  Urinary tract infection (UTI). How is this diagnosed? This condition is diagnosed based on your medical history, a physical exam, and your symptoms. Tests will also be done, such as:  A post-void bladder scan. This measures any amount of urine that may remain in your bladder after you finish urinating.  A digital rectal exam. In a rectal exam, your health care provider checks your prostate by putting a lubricated, gloved finger into your rectum to feel the back of your prostate gland. This exam detects the size of your gland and any abnormal lumps or growths.  An exam of your urine (urinalysis).  A prostate specific antigen (PSA) screening. This is a blood test used to screen for prostate cancer.  An ultrasound. This test uses sound waves to electronically produce a picture of your prostate gland. Your health care provider may refer you to a specialist in kidney and prostate diseases (urologist). How is this treated? Once symptoms begin, your health care provider will monitor your condition (active  surveillance or watchful waiting). Treatment for this condition will depend on the severity of your condition. Treatment may include:  Observation and yearly exams. This may be the only treatment needed if your condition and symptoms are mild.  Medicines to relieve your symptoms, including: ? Medicines to shrink the prostate. ? Medicines to relax the muscle of the prostate.  Surgery in severe cases. Surgery may include: ? Prostatectomy. In this procedure, the prostate tissue is removed completely through an open incision or with a laparoscope or robotics. ? Transurethral resection of the prostate (TURP). In this procedure, a tool is inserted through the opening at the tip of the penis (urethra). It is used to cut away tissue of the inner core of the prostate. The pieces are removed through the same opening of the penis. This removes the blockage. ? Transurethral incision (TUIP). In this procedure, small cuts are made in the prostate. This lessens the prostate's pressure on the urethra. ? Transurethral microwave thermotherapy (TUMT). This procedure uses microwaves to create heat. The heat destroys and removes a small amount of prostate tissue. ? Transurethral needle ablation (TUNA). This procedure uses radio frequencies to destroy and remove a small amount of prostate tissue. ? Interstitial laser coagulation (Webb). This procedure uses a laser to destroy and remove a small amount of prostate tissue. ? Transurethral electrovaporization (TUVP). This procedure uses electrodes to destroy and remove a small amount of prostate tissue. ? Prostatic urethral lift. This procedure inserts an implant to push the lobes of the prostate away from the urethra. Follow these instructions at home:  Take over-the-counter and prescription medicines only as told by your  health care provider.  Monitor your symptoms for any changes. Contact your health care provider with any changes.  Avoid drinking large amounts of  liquid before going to bed or out in public.  Avoid or reduce how much caffeine or alcohol you drink.  Give yourself time when you urinate.  Keep all follow-up visits as told by your health care provider. This is important. Contact a health care provider if:  You have unexplained back pain.  Your symptoms do not get better with treatment.  You develop side effects from the medicine you are taking.  Your urine becomes very dark or has a bad smell.  Your lower abdomen becomes distended and you have trouble passing your urine. Get help right away if:  You have a fever or chills.  You suddenly cannot urinate.  You feel lightheaded, or very dizzy, or you faint.  There are large amounts of blood or clots in the urine.  Your urinary problems become hard to manage.  You develop moderate to severe low back or flank pain. The flank is the side of your body between the ribs and the hip. These symptoms may represent a serious problem that is an emergency. Do not wait to see if the symptoms will go away. Get medical help right away. Call your local emergency services (911 in the [place] Do not drive yourself to the hospital. Summary  Benign prostatic hyperplasia (BPH) is an enlarged prostate that is caused by the normal aging process and not by cancer.  An enlarged prostate can press on the urethra. This can make it hard to pass urine.  This condition is part of a normal aging process and is more likely to develop in men over the age of 89 years.  Get help right away if you suddenly cannot urinate. This information is not intended to replace advice given to you by your health care provider. Make sure you discuss any questions you have with your health care provider. Document Revised: 12/20/2017 Document Reviewed: 03/01/2016 Elsevier Patient Education  2020 [employer].

## 2019-06-14 ENCOUNTER — Encounter: Payer: Medicare Other | Admitting: Internal Medicine

## 2019-08-01 ENCOUNTER — Telehealth: Payer: Self-pay | Admitting: Family Medicine

## 2019-08-01 NOTE — Telephone Encounter (Signed)
Left message for patient to schedule Annual Wellness Visit.  Please schedule with Nurse Health Advisor Martha Stanley, RN at [facility] [place] [facility]  °

## 2019-09-18 NOTE — Progress Notes (Signed)
Subjective:   Randall Kirby is a 74 y.o. male who presents for Medicare Annual/Subsequent preventive examination.  Review of Systems     Cardiac Risk Factors include: hypertension;dyslipidemia;advanced age (>52men, >53 women);male gender     Objective:    Today's Vitals   09/19/19 1108  BP: 134/80  Pulse: 68  Resp: 16  Temp: 97.7 F (36.5 C)  TempSrc: Temporal  SpO2: 97%  Weight: 190 lb 12.8 oz (86.5 kg)  Height: 5\' 11"  (1.803 m)   Body mass index is 26.61 kg/m.  Advanced Directives 09/19/2019  Does Patient Have a Medical Advance Directive? No  Would patient like information on creating a medical advance directive? No - Patient declined    Current Medications (verified) Outpatient Encounter Medications as of 09/19/2019  Medication Sig  . aspirin 81 MG tablet Take 81 mg by mouth daily.    Marland Kitchen lisinopril-hydrochlorothiazide (ZESTORETIC) 20-12.5 MG tablet Take 0.5 tablets by mouth daily.   No facility-administered encounter medications on file as of 09/19/2019.    Allergies (verified) Penicillins   History: Past Medical History:  Diagnosis Date  . Cataract    per pt  . Hypertension   . Sleep apnea    Past Surgical History:  Procedure Laterality Date  . CATARACT EXTRACTION, BILATERAL Bilateral   . CHOLECYSTECTOMY    . COLONOSCOPY  05/31/2013  . lumbar HNP    . SKIN CANCER EXCISION     nose   Family History  Problem Relation Age of Onset  . Asthma Other   . Heart disease Other   . Heart Problems Mother   . Colon cancer Neg Hx   . Colon polyps Neg Hx   . Esophageal cancer Neg Hx   . Rectal cancer Neg Hx   . Stomach cancer Neg Hx    Social History   Socioeconomic History  . Marital status: Married    Spouse name: Not on file  . Number of children: Not on file  . Years of education: Not on file  . Highest education level: Not on file  Occupational History  . Not on file  Tobacco Use  . Smoking status: Former Smoker    Packs/day: 1.00     Years: 3.00    Pack years: 3.00    Types: Cigarettes    Quit date: 02/08/1966    Years since quitting: 53.6  . Smokeless tobacco: Never Used  Vaping Use  . Vaping Use: Never used  Substance and Sexual Activity  . Alcohol use: Yes    Comment: ocassional beer/rare 1-2 beers per year per patient/Rm  . Drug use: No  . Sexual activity: Not on file  Other Topics Concern  . Not on file  Social History Narrative  . Not on file   Social Determinants of Health   Financial Resource Strain: Low Risk   . Difficulty of Paying Living Expenses: Not hard at all  Food Insecurity: No Food Insecurity  . Worried About Charity fundraiser in the Last Year: Never true  . Ran Out of Food in the Last Year: Never true  Transportation Needs: No Transportation Needs  . Lack of Transportation (Medical): No  . Lack of Transportation (Non-Medical): No  Physical Activity: Inactive  . Days of Exercise per Week: 0 days  . Minutes of Exercise per Session: 0 min  Stress: No Stress Concern Present  . Feeling of Stress : Not at all  Social Connections: Moderately Isolated  . Frequency of Communication with  Friends and Family: More than three times a week  . Frequency of Social Gatherings with Friends and Family: More than three times a week  . Attends Religious Services: Never  . Active Member of Clubs or Organizations: No  . Attends Archivist Meetings: Never  . Marital Status: Married    Tobacco Counseling Counseling given: Not Answered   Clinical Intake:  Pre-visit preparation completed: Yes  Pain : No/denies pain     Nutritional Status: BMI 25 -29 Overweight Nutritional Risks: None Diabetes: No  How often do you need to have someone help you when you read instructions, pamphlets, or other written materials from your doctor or pharmacy?: 1 - Never What is the last grade level you completed in school?: high school graduate  Diabetic?No  Interpreter Needed?: No  Information  entered by :: Caroleen Hamman LPN   Activities of Daily Living In your present state of health, do you have any difficulty performing the following activities: 09/19/2019  Hearing? N  Vision? N  Difficulty concentrating or making decisions? N  Walking or climbing stairs? N  Dressing or bathing? N  Doing errands, shopping? N  Preparing Food and eating ? N  Using the Toilet? N  In the past six months, have you accidently leaked urine? N  Do you have problems with loss of bowel control? N  Managing your Medications? N  Managing your Finances? N  Housekeeping or managing your Housekeeping? N  Some recent data might be hidden    Patient Care Team: Libby Maw, MD as PCP - General (Family Medicine) Shon Hough, MD as Consulting Physician (Ophthalmology)  Indicate any recent Medical Services you may have received from other than [facility] providers in the past year (date may be approximate).     Assessment:   This is a routine wellness examination for [place].  Hearing/Vision screen  Hearing Screening   125Hz  250Hz  500Hz  1000Hz  2000Hz  3000Hz  4000Hz  6000Hz  8000Hz   Right ear:           Left ear:           Comments: No issues  Vision Screening Comments: Cataract surgery Last eye exam-09/2019 Dr Kathrin Penner  Dietary issues and exercise activities discussed: Current Exercise Habits: The patient does not participate in regular exercise at present (works around the house & does his own lawn care.)  Goals    . Patient Stated     Maintain current health      Depression Screen PHQ 2/9 Scores 09/19/2019 06/12/2019 06/12/2019 01/09/2019 05/14/2015 04/16/2014 12/26/2012  PHQ - 2 Score 0 0 0 0 0 0 0  PHQ- 9 Score - 0 - - - - -    Fall Risk Fall Risk  09/19/2019 06/12/2019 01/09/2019 12/28/2017 08/31/2016  Falls in the past year? 0 0 0 0 No  Comment - - - Emmi Telephone Survey: data to providers prior to load [employer] Telephone Survey: data to providers prior to load  Number falls in past  yr: 0 - 0 - -  Injury with Fall? 0 - 0 - -  Follow up Falls prevention discussed - Falls evaluation completed - -    Any stairs in or around the home? No  Home free of loose throw rugs in walkways, pet beds, electrical cords, etc? Yes  Adequate lighting in your home to reduce risk of falls? Yes   ASSISTIVE DEVICES UTILIZED TO PREVENT FALLS:  Life alert? No  Use of a cane, walker or w/c? No  Grab  bars in the bathroom? Yes  Shower chair or bench in shower? No  Elevated toilet seat or a handicapped toilet? No   TIMED UP AND GO:  Was the test performed? Yes .  Length of time to ambulate 10 feet: 10 sec.   Gait steady and fast without use of assistive device  Cognitive Function:No cognitive impairment noted.        Immunizations Immunization History  Administered Date(s) Administered  . Influenza Split 02/08/2012  . Influenza, High Dose Seasonal PF 01/19/2016, 10/19/2016, 10/24/2017  . PFIZER SARS-COV-2 Vaccination 03/19/2019, 04/11/2019  . Pneumococcal Conjugate-13 04/16/2014  . Pneumococcal Polysaccharide-23 06/13/2008, 05/14/2015  . Td 02/09/1995, 06/13/2008  . Tdap 04/15/2015  . Zoster 12/20/2012    TDAP status: Up to date   Flu Vaccine status: Up to date   Pneumococcal vaccine status: Up to date   Covid-19 vaccine status: Completed vaccines  Qualifies for Shingles Vaccine? Yes   Zostavax completed Yes   Shingrix Completed?: No.    Education has been provided regarding the importance of this vaccine. Patient has been advised to call insurance company to determine out of pocket expense if they have not yet received this vaccine. Advised may also receive vaccine at local pharmacy or Health Dept. Verbalized acceptance and understanding.  Screening Tests Health Maintenance  Topic Date Due  . Hepatitis C Screening  Never done  . INFLUENZA VACCINE  09/09/2019  . COLONOSCOPY  04/29/2024  . TETANUS/TDAP  04/14/2025  . COVID-19 Vaccine  Completed  . PNA vac Low  Risk Adult  Completed    Health Maintenance  Health Maintenance Due  Topic Date Due  . Hepatitis C Screening  Never done  . INFLUENZA VACCINE  09/09/2019    Colorectal cancer screening: Completed 04/30/2019. Repeat every 5 years  Lung Cancer Screening: (Low Dose CT Chest recommended if Age 6-80 years, 30 pack-year currently smoking OR have quit w/in 15years.) does not qualify.    Additional Screening:  Hepatitis C Screening: does qualify;Discuss with PCP  Vision Screening: Recommended annual ophthalmology exams for early detection of glaucoma and other disorders of the eye. Is the patient up to date with their annual eye exam?  Yes  Who is the provider or what is the name of the office in which the patient attends annual eye exams? Dr. Kathrin Penner   Dental Screening: Recommended annual dental exams for proper oral hygiene  Community Resource Referral / Chronic Care Management: CRR required this visit?  No   CCM required this visit?  No      Plan:     I have personally reviewed and noted the following in the patient's chart:   . Medical and social history . Use of alcohol, tobacco or illicit drugs  . Current medications and supplements . Functional ability and status . Nutritional status . Physical activity . Advanced directives . List of other physicians . Hospitalizations, surgeries, and ER visits in previous 12 months . Vitals . Screenings to include cognitive, depression, and falls . Referrals and appointments  In addition, I have reviewed and discussed with patient certain preventive protocols, quality metrics, and best practice recommendations. A written personalized care plan for preventive services as well as general preventive health recommendations were provided to patient.  Patient ro access AVS via mychart.     Marta Antu, LPN   [id number]  Nurse Health Advisor  Nurse Notes: None

## 2019-09-19 ENCOUNTER — Other Ambulatory Visit: Payer: Self-pay

## 2019-09-19 ENCOUNTER — Ambulatory Visit (INDEPENDENT_AMBULATORY_CARE_PROVIDER_SITE_OTHER): Payer: Medicare Other

## 2019-09-19 VITALS — BP 134/80 | HR 68 | Temp 97.7°F | Resp 16 | Ht 71.0 in | Wt 190.8 lb

## 2019-09-19 DIAGNOSIS — Z0000 Encounter for general adult medical examination without abnormal findings: Secondary | ICD-10-CM | POA: Diagnosis not present

## 2019-09-19 NOTE — Patient Instructions (Signed)
Randall Kirby , Thank you for taking time to come for your Medicare Wellness Visit. I appreciate your ongoing commitment to your health goals. Please review the following plan we discussed and let me know if I can assist you in the future.   Screening recommendations/referrals: Colonoscopy: Completed 04/30/2019-Due 04/29/2024 Recommended yearly ophthalmology/optometry visit for glaucoma screening and checkup Recommended yearly dental visit for hygiene and checkup  Vaccinations: Influenza vaccine: Due 10/2019 Pneumococcal vaccine: Completed vaccines Tdap vaccine: Up to Date- Due-04/14/2025 Shingles vaccine: Discuss with pharmacy  Covid-19: Completed vaccine  Advanced directives: Discussed. Declined information today.  Conditions/risks identified: See problem list  Next appointment: Follow up in one year for your annual wellness visit. 09/24/2020 @ 12:45  Preventive Care 65 Years and Older, Male Preventive care refers to lifestyle choices and visits with your health care provider that can promote health and wellness. What does preventive care include?  A yearly physical exam. This is also called an annual well check.  Dental exams once or twice a year.  Routine eye exams. Ask your health care provider how often you should have your eyes checked.  Personal lifestyle choices, including:  Daily care of your teeth and gums.  Regular physical activity.  Eating a healthy diet.  Avoiding tobacco and drug use.  Limiting alcohol use.  Practicing safe sex.  Taking low doses of aspirin every day.  Taking vitamin and mineral supplements as recommended by your health care provider. What happens during an annual well check? The services and screenings done by your health care provider during your annual well check will depend on your age, overall health, lifestyle risk factors, and family history of disease. Counseling  Your health care provider may ask you questions about your:  Alcohol  use.  Tobacco use.  Drug use.  Emotional well-being.  Home and relationship well-being.  Sexual activity.  Eating habits.  History of falls.  Memory and ability to understand (cognition).  Work and work Statistician. Screening  You may have the following tests or measurements:  Height, weight, and BMI.  Blood pressure.  Lipid and cholesterol levels. These may be checked every 5 years, or more frequently if you are over 24 years old.  Skin check.  Lung cancer screening. You may have this screening every year starting at age 97 if you have a 30-pack-year history of smoking and currently smoke or have quit within the past 15 years.  Fecal occult blood test (FOBT) of the stool. You may have this test every year starting at age 29.  Flexible sigmoidoscopy or colonoscopy. You may have a sigmoidoscopy every 5 years or a colonoscopy every 10 years starting at age 56.  Prostate cancer screening. Recommendations will vary depending on your family history and other risks.  Hepatitis C blood test.  Hepatitis B blood test.  Sexually transmitted disease (STD) testing.  Diabetes screening. This is done by checking your blood sugar (glucose) after you have not eaten for a while (fasting). You may have this done every 1-3 years.  Abdominal aortic aneurysm (AAA) screening. You may need this if you are a current or former smoker.  Osteoporosis. You may be screened starting at age 64 if you are at high risk. Talk with your health care provider about your test results, treatment options, and if necessary, the need for more tests. Vaccines  Your health care provider may recommend certain vaccines, such as:  Influenza vaccine. This is recommended every year.  Tetanus, diphtheria, and acellular pertussis (Tdap, Td)  vaccine. You may need a Td booster every 10 years.  Zoster vaccine. You may need this after age 57.  Pneumococcal 13-valent conjugate (PCV13) vaccine. One dose is  recommended after age 61.  Pneumococcal polysaccharide (PPSV23) vaccine. One dose is recommended after age 8. Talk to your health care provider about which screenings and vaccines you need and how often you need them. This information is not intended to replace advice given to you by your health care provider. Make sure you discuss any questions you have with your health care provider. Document Released: 02/21/2015 Document Revised: 10/15/2015 Document Reviewed: 11/26/2014 Elsevier Interactive Patient Education  2017 [place] Prevention in the Home Falls can cause injuries. They can happen to people of all ages. There are many things you can do to make your home safe and to help prevent falls. What can I do on the outside of my home?  Regularly fix the edges of walkways and driveways and fix any cracks.  Remove anything that might make you trip as you walk through a door, such as a raised step or threshold.  Trim any bushes or trees on the path to your home.  Use bright outdoor lighting.  Clear any walking paths of anything that might make someone trip, such as rocks or tools.  Regularly check to see if handrails are loose or broken. Make sure that both sides of any steps have handrails.  Any raised decks and porches should have guardrails on the edges.  Have any leaves, snow, or ice cleared regularly.  Use sand or salt on walking paths during winter.  Clean up any spills in your garage right away. This includes oil or grease spills. What can I do in the bathroom?  Use night lights.  Install grab bars by the toilet and in the tub and shower. Do not use towel bars as grab bars.  Use non-skid mats or decals in the tub or shower.  If you need to sit down in the shower, use a plastic, non-slip stool.  Keep the floor dry. Clean up any water that spills on the floor as soon as it happens.  Remove soap buildup in the tub or shower regularly.  Attach bath mats  securely with double-sided non-slip rug tape.  Do not have throw rugs and other things on the floor that can make you trip. What can I do in the bedroom?  Use night lights.  Make sure that you have a light by your bed that is easy to reach.  Do not use any sheets or blankets that are too big for your bed. They should not hang down onto the floor.  Have a firm chair that has side arms. You can use this for support while you get dressed.  Do not have throw rugs and other things on the floor that can make you trip. What can I do in the kitchen?  Clean up any spills right away.  Avoid walking on wet floors.  Keep items that you use a lot in easy-to-reach places.  If you need to reach something above you, use a strong step stool that has a grab bar.  Keep electrical cords out of the way.  Do not use floor polish or wax that makes floors slippery. If you must use wax, use non-skid floor wax.  Do not have throw rugs and other things on the floor that can make you trip. What can I do with my stairs?  Do not leave  any items on the stairs.  Make sure that there are handrails on both sides of the stairs and use them. Fix handrails that are broken or loose. Make sure that handrails are as long as the stairways.  Check any carpeting to make sure that it is firmly attached to the stairs. Fix any carpet that is loose or worn.  Avoid having throw rugs at the top or bottom of the stairs. If you do have throw rugs, attach them to the floor with carpet tape.  Make sure that you have a light switch at the top of the stairs and the bottom of the stairs. If you do not have them, ask someone to add them for you. What else can I do to help prevent falls?  Wear shoes that:  Do not have high heels.  Have rubber bottoms.  Are comfortable and fit you well.  Are closed at the toe. Do not wear sandals.  If you use a stepladder:  Make sure that it is fully opened. Do not climb a closed  stepladder.  Make sure that both sides of the stepladder are locked into place.  Ask someone to hold it for you, if possible.  Clearly mark and make sure that you can see:  Any grab bars or handrails.  First and last steps.  Where the edge of each step is.  Use tools that help you move around (mobility aids) if they are needed. These include:  Canes.  Walkers.  Scooters.  Crutches.  Turn on the lights when you go into a dark area. Replace any light bulbs as soon as they burn out.  Set up your furniture so you have a clear path. Avoid moving your furniture around.  If any of your floors are uneven, fix them.  If there are any pets around you, be aware of where they are.  Review your medicines with your doctor. Some medicines can make you feel dizzy. This can increase your chance of falling. Ask your doctor what other things that you can do to help prevent falls. This information is not intended to replace advice given to you by your health care provider. Make sure you discuss any questions you have with your health care provider. Document Released: 11/21/2008 Document Revised: 07/03/2015 Document Reviewed: 03/01/2014 Elsevier Interactive Patient Education  2017 [employer].

## 2019-12-19 ENCOUNTER — Ambulatory Visit: Payer: Medicare Other | Admitting: Family Medicine

## 2020-04-24 DIAGNOSIS — L821 Other seborrheic keratosis: Secondary | ICD-10-CM | POA: Diagnosis not present

## 2020-04-24 DIAGNOSIS — D2262 Melanocytic nevi of left upper limb, including shoulder: Secondary | ICD-10-CM | POA: Diagnosis not present

## 2020-04-24 DIAGNOSIS — D1801 Hemangioma of skin and subcutaneous tissue: Secondary | ICD-10-CM | POA: Diagnosis not present

## 2020-04-24 DIAGNOSIS — D2261 Melanocytic nevi of right upper limb, including shoulder: Secondary | ICD-10-CM | POA: Diagnosis not present

## 2020-04-24 DIAGNOSIS — L570 Actinic keratosis: Secondary | ICD-10-CM | POA: Diagnosis not present

## 2020-04-24 DIAGNOSIS — Z85828 Personal history of other malignant neoplasm of skin: Secondary | ICD-10-CM | POA: Diagnosis not present

## 2020-04-24 DIAGNOSIS — D225 Melanocytic nevi of trunk: Secondary | ICD-10-CM | POA: Diagnosis not present

## 2020-04-24 DIAGNOSIS — D0462 Carcinoma in situ of skin of left upper limb, including shoulder: Secondary | ICD-10-CM | POA: Diagnosis not present

## 2020-04-24 DIAGNOSIS — L814 Other melanin hyperpigmentation: Secondary | ICD-10-CM | POA: Diagnosis not present

## 2020-04-29 ENCOUNTER — Telehealth: Payer: Self-pay | Admitting: Family Medicine

## 2020-04-29 ENCOUNTER — Other Ambulatory Visit: Payer: Self-pay | Admitting: Internal Medicine

## 2020-04-29 ENCOUNTER — Other Ambulatory Visit: Payer: Self-pay

## 2020-04-29 DIAGNOSIS — I10 Essential (primary) hypertension: Secondary | ICD-10-CM

## 2020-04-29 MED ORDER — LISINOPRIL-HYDROCHLOROTHIAZIDE 20-12.5 MG PO TABS: 0.5000 | ORAL_TABLET | Freq: Every day | ORAL | 1 refills | Status: DC

## 2020-04-29 NOTE — Telephone Encounter (Signed)
Patient is calling to get a refill on his Lisinopril. He states that Dr. Ethelene Hal did not prescribe it, but is aware that he takes this. If approved, please send to [facility] and call patient at [phone] to let him know that it has been sent in.

## 2020-04-29 NOTE — Telephone Encounter (Signed)
Rx sent to pharmacy patient aware ?

## 2020-05-01 ENCOUNTER — Telehealth: Payer: Self-pay | Admitting: Cardiology

## 2020-05-01 DIAGNOSIS — I10 Essential (primary) hypertension: Secondary | ICD-10-CM

## 2020-05-01 DIAGNOSIS — Z01812 Encounter for preprocedural laboratory examination: Secondary | ICD-10-CM

## 2020-05-01 DIAGNOSIS — I712 Thoracic aortic aneurysm, without rupture, unspecified: Secondary | ICD-10-CM

## 2020-05-01 NOTE — Telephone Encounter (Signed)
Patient is aware of Monday 05/05/20 10:30 am at [facility] time is 10:00 am--1st floor radiology-- wate only after 6:30 am on Monday -patient to come Friday 05/02/20 for labs.

## 2020-05-01 NOTE — Telephone Encounter (Signed)
Spoke to patient's wife she stated husband is past due to have chest ct.Orders placed.Advised to have bmet done 1 week before.Scheduler will call with appointment.

## 2020-05-01 NOTE — Telephone Encounter (Signed)
Patient's wife feels like they miss the opportunity for the patient to have a scan down. She thought he was due for a scan or to have procedure done but I dont see anything in the patient chart. She feels that he suppose to have yearly check up of some sort. Please advise

## 2020-05-01 NOTE — Addendum Note (Signed)
Addended by: Kathyrn Lass on: 05/01/2020 03:15 PM   Modules accepted: Orders

## 2020-05-02 DIAGNOSIS — Z01812 Encounter for preprocedural laboratory examination: Secondary | ICD-10-CM | POA: Diagnosis not present

## 2020-05-02 DIAGNOSIS — I10 Essential (primary) hypertension: Secondary | ICD-10-CM | POA: Diagnosis not present

## 2020-05-02 LAB — BASIC METABOLIC PANEL
BUN/Creatinine Ratio: 14 (ref 10–24)
BUN: 13 mg/dL (ref 8–27)
CO2: 22 mmol/L (ref 20–29)
Calcium: 9 mg/dL (ref 8.6–10.2)
Chloride: 101 mmol/L (ref 96–106)
Creatinine, Ser: 0.92 mg/dL (ref 0.76–1.27)
Glucose: 98 mg/dL (ref 65–99)
Potassium: 3.9 mmol/L (ref 3.5–5.2)
Sodium: 139 mmol/L (ref 134–144)
eGFR: 88 mL/min/{1.73_m2} (ref >59)

## 2020-05-02 LAB — SPECIMEN STATUS REPORT

## 2020-05-05 ENCOUNTER — Ambulatory Visit (HOSPITAL_COMMUNITY)
Admission: RE | Admit: 2020-05-05 | Discharge: 2020-05-05 | Disposition: A | Discharge status: Home / Self Care | Payer: Medicare Other | Source: Ambulatory Visit | Attending: Cardiology | Admitting: Cardiology

## 2020-05-05 ENCOUNTER — Other Ambulatory Visit: Payer: Self-pay

## 2020-05-05 DIAGNOSIS — I712 Thoracic aortic aneurysm, without rupture, unspecified: Secondary | ICD-10-CM

## 2020-05-05 DIAGNOSIS — R911 Solitary pulmonary nodule: Secondary | ICD-10-CM | POA: Diagnosis not present

## 2020-05-05 MED ORDER — IOHEXOL 350 MG/ML SOLN
75.0000 mL | Freq: Once | INTRAVENOUS | Status: AC | PRN
  Administered 2020-05-05: 75 mL via INTRAVENOUS

## 2020-05-05 MED ORDER — IOHEXOL 300 MG/ML  SOLN: 100.0000 mL | Freq: Once | INTRAMUSCULAR | Status: DC | PRN

## 2020-05-11 IMAGING — DX DG CHEST 2V
2 series · 2 of 2 positions shown · non-contrast
Comparison: None.

CLINICAL DATA: Shortness of breath.  Hypertension.  Former smoker.

EXAM:
CHEST - 2 VIEW

[chest pa]
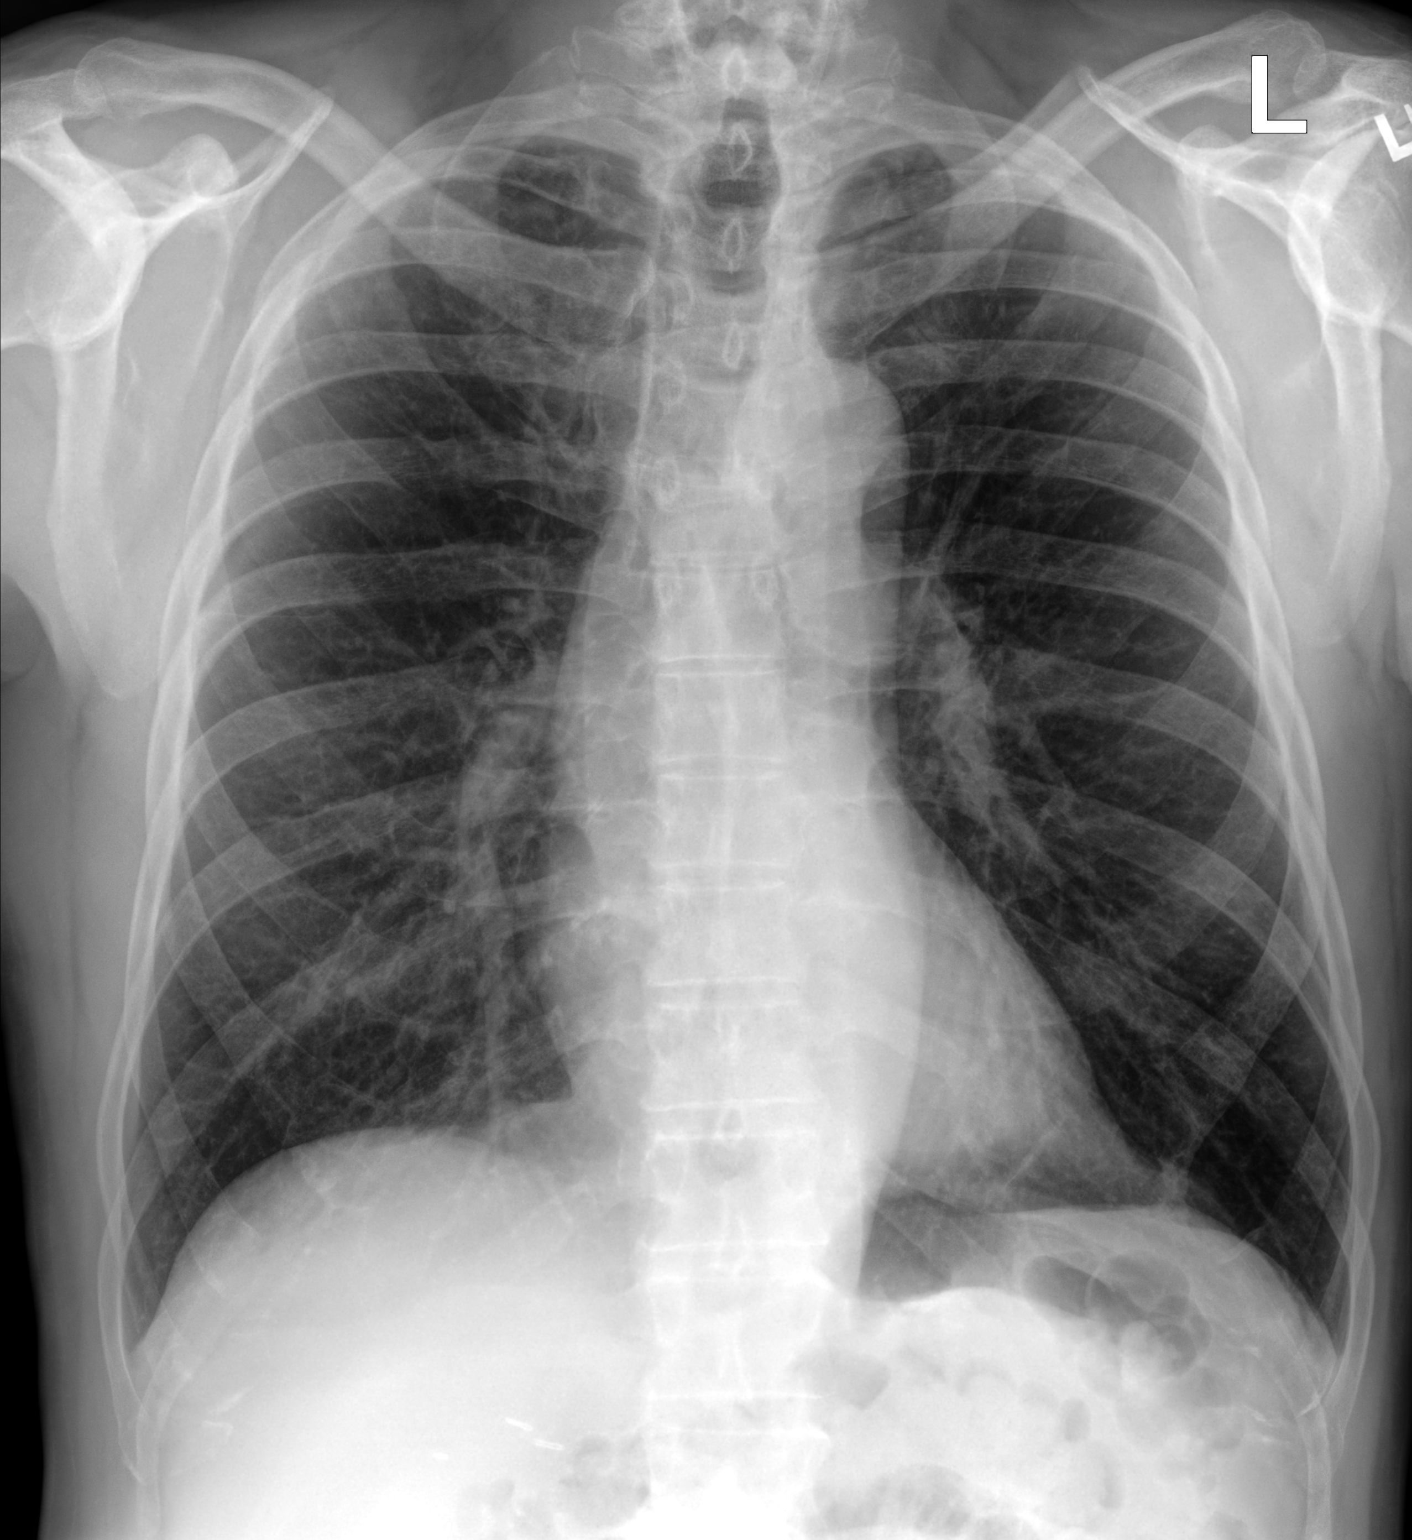

[chest lat]
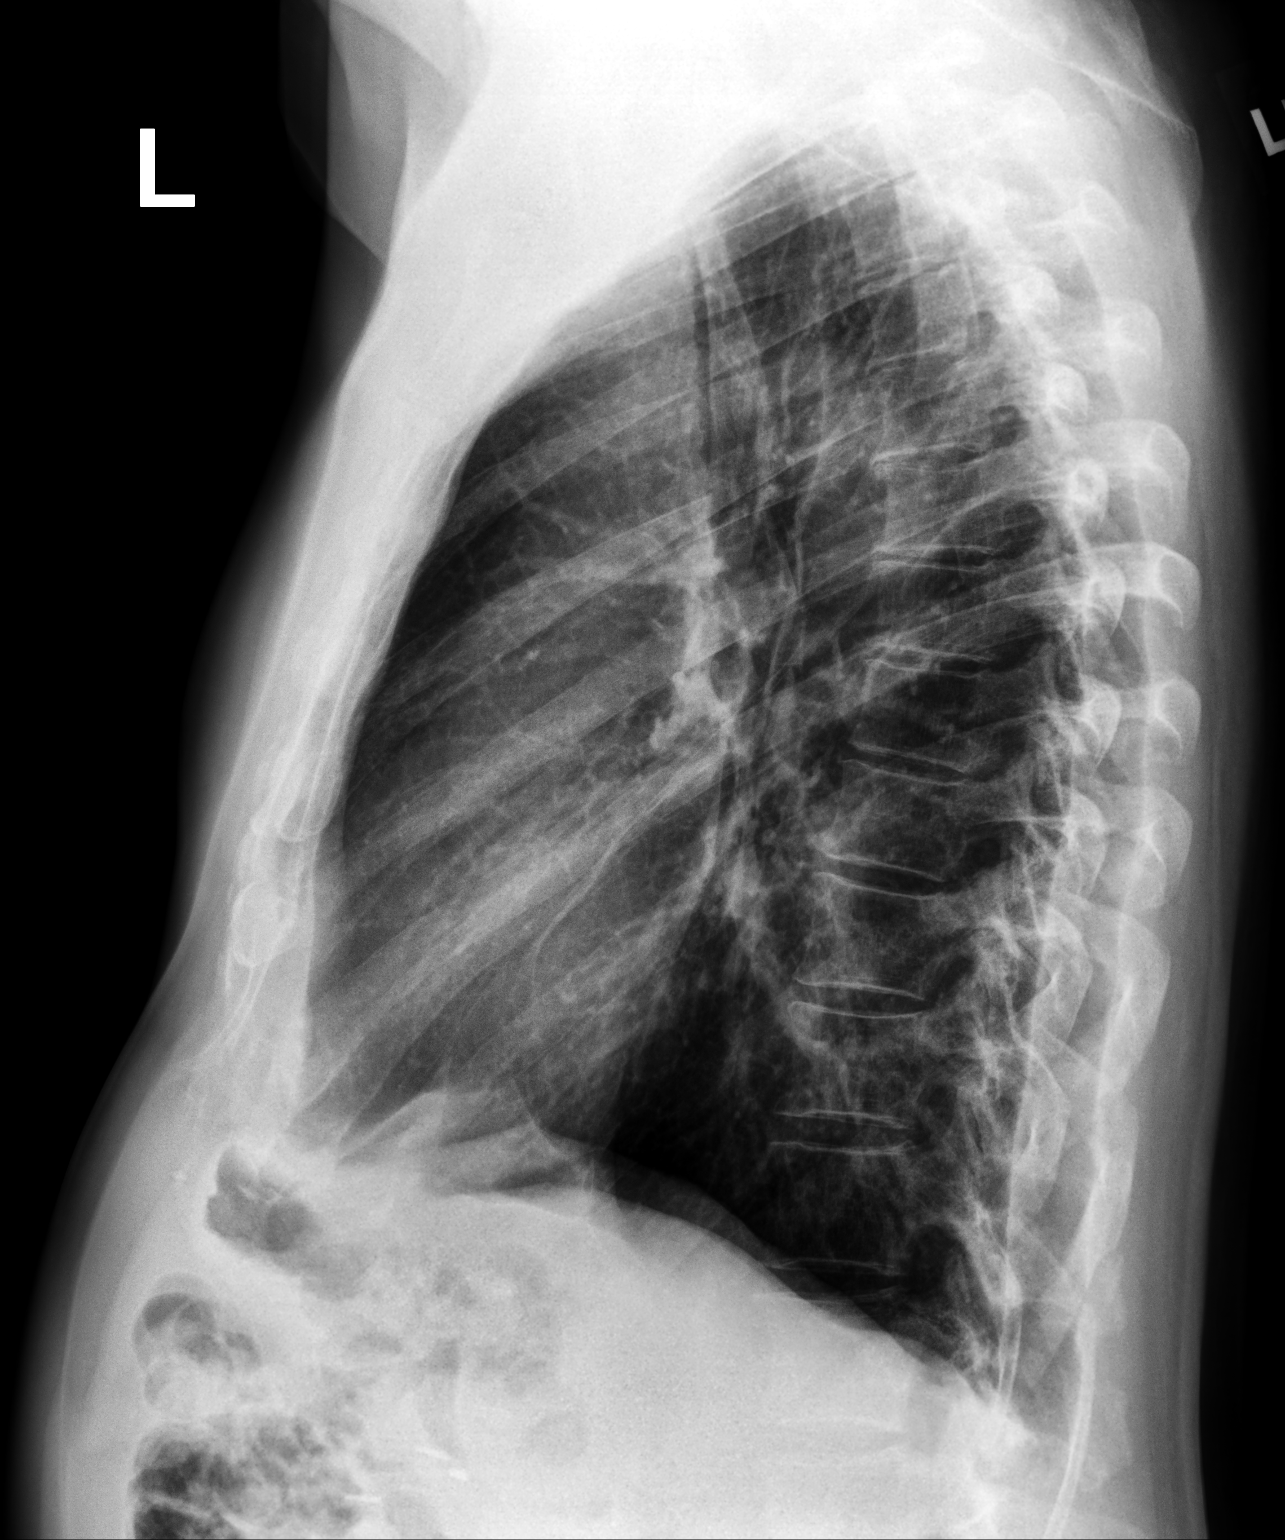

[2 of 2 positions shown; findings below may reference images not displayed]

FINDINGS: Mild hyperinflation of the lungs. Heart is normal size. Lungs clear.
No effusions or acute bony abnormality.
IMPRESSION: Mild hyperinflation.  No active disease.

## 2020-05-21 ENCOUNTER — Ambulatory Visit: Payer: Medicare Other | Admitting: Family Medicine

## 2020-07-16 ENCOUNTER — Other Ambulatory Visit: Payer: Self-pay

## 2020-07-16 ENCOUNTER — Encounter: Payer: Self-pay | Admitting: Family Medicine

## 2020-07-16 ENCOUNTER — Ambulatory Visit (INDEPENDENT_AMBULATORY_CARE_PROVIDER_SITE_OTHER): Payer: Medicare Other | Admitting: Family Medicine

## 2020-07-16 VITALS — BP 154/82 | HR 73 | Temp 97.8°F | Ht 71.0 in | Wt 193.8 lb

## 2020-07-16 DIAGNOSIS — R351 Nocturia: Secondary | ICD-10-CM

## 2020-07-16 DIAGNOSIS — S30861A Insect bite (nonvenomous) of abdominal wall, initial encounter: Secondary | ICD-10-CM | POA: Diagnosis not present

## 2020-07-16 DIAGNOSIS — M2042 Other hammer toe(s) (acquired), left foot: Secondary | ICD-10-CM | POA: Insufficient documentation

## 2020-07-16 DIAGNOSIS — I10 Essential (primary) hypertension: Secondary | ICD-10-CM

## 2020-07-16 DIAGNOSIS — R911 Solitary pulmonary nodule: Secondary | ICD-10-CM | POA: Diagnosis not present

## 2020-07-16 DIAGNOSIS — M2041 Other hammer toe(s) (acquired), right foot: Secondary | ICD-10-CM | POA: Diagnosis not present

## 2020-07-16 DIAGNOSIS — E782 Mixed hyperlipidemia: Secondary | ICD-10-CM | POA: Diagnosis not present

## 2020-07-16 DIAGNOSIS — R918 Other nonspecific abnormal finding of lung field: Secondary | ICD-10-CM

## 2020-07-16 DIAGNOSIS — W57XXXA Bitten or stung by nonvenomous insect and other nonvenomous arthropods, initial encounter: Secondary | ICD-10-CM | POA: Diagnosis not present

## 2020-07-16 DIAGNOSIS — N401 Enlarged prostate with lower urinary tract symptoms: Secondary | ICD-10-CM | POA: Diagnosis not present

## 2020-07-16 DIAGNOSIS — L84 Corns and callosities: Secondary | ICD-10-CM | POA: Insufficient documentation

## 2020-07-16 LAB — COMPREHENSIVE METABOLIC PANEL
ALT: 14 U/L (ref 0–53)
AST: 20 U/L (ref 0–37)
Albumin: 4.4 g/dL (ref 3.5–5.2)
Alkaline Phosphatase: 47 U/L (ref 39–117)
BUN: 16 mg/dL (ref 6–23)
CO2: 27 mEq/L (ref 19–32)
Calcium: 8.7 mg/dL (ref 8.4–10.5)
Chloride: 103 mEq/L (ref 96–112)
Creatinine, Ser: 0.93 mg/dL (ref 0.40–1.50)
GFR: 81.46 mL/min (ref >60.00)
Glucose, Bld: 98 mg/dL (ref 70–99)
Potassium: 3.9 mEq/L (ref 3.5–5.1)
Sodium: 138 mEq/L (ref 135–145)
Total Bilirubin: 1 mg/dL (ref 0.2–1.2)
Total Protein: 7.2 g/dL (ref 6.0–8.3)

## 2020-07-16 LAB — URINALYSIS, ROUTINE W REFLEX MICROSCOPIC
Bilirubin Urine: NEGATIVE
Ketones, ur: NEGATIVE
Leukocytes,Ua: NEGATIVE
Nitrite: NEGATIVE
RBC / HPF: NONE SEEN (ref 0–?)
Specific Gravity, Urine: 1.015 (ref 1.000–1.030)
Total Protein, Urine: NEGATIVE
Urine Glucose: NEGATIVE
Urobilinogen, UA: 0.2 (ref 0.0–1.0)
pH: 6 (ref 5.0–8.0)

## 2020-07-16 LAB — LIPID PANEL
Cholesterol: 154 mg/dL (ref 0–200)
HDL: 36 mg/dL — ABNORMAL LOW (ref >39.00)
NonHDL: 118.27
Total CHOL/HDL Ratio: 4
Triglycerides: 222 mg/dL — ABNORMAL HIGH (ref 0.0–149.0)
VLDL: 44.4 mg/dL — ABNORMAL HIGH (ref 0.0–40.0)

## 2020-07-16 LAB — CBC
HCT: 43.1 % (ref 39.0–52.0)
Hemoglobin: 14.2 g/dL (ref 13.0–17.0)
MCHC: 32.9 g/dL (ref 30.0–36.0)
MCV: 87.6 fl (ref 78.0–100.0)
Platelets: 194 10*3/uL (ref 150.0–400.0)
RBC: 4.92 Mil/uL (ref 4.22–5.81)
RDW: 13.2 % (ref 11.5–15.5)
WBC: 5 10*3/uL (ref 4.0–10.5)

## 2020-07-16 LAB — LDL CHOLESTEROL, DIRECT: Direct LDL: 90 mg/dL

## 2020-07-16 LAB — PSA: PSA: 1.12 ng/mL (ref 0.10–4.00)

## 2020-07-16 MED ORDER — LISINOPRIL-HYDROCHLOROTHIAZIDE 20-12.5 MG PO TABS: 1.0000 | ORAL_TABLET | Freq: Every day | ORAL | 1 refills | Status: DC

## 2020-07-16 NOTE — Progress Notes (Addendum)
Established Patient Office Visit  Subjective:  Patient ID: Randall Kirby, male    DOB: 1945-08-22  Age: 75 y.o. MRN: [id number]  CC:  Chief Complaint  Patient presents with   Annual Exam    Annual exam concerns about hammer toe on right foot, tick bite at waist line patient would like checked.     HPI Randall Kirby presents for follow-up of hypertension, elevated triglycerides, callus of third toe on right foot, he has pain in the balls of his feet sometimes.  BPH and tick bite of right lower abdomen at the belt line.  Tick bite was 2 weeks ago.  He has had no fever chills headache rash.  Nocturia is down to once at night.  He has been fluid restricting before bedtime.  Recent [facility] CT scan showed a 4 x 6 mm lesion in the left lower lobe.  He quit smoking when he was 75 years old.  Reading radiologist felt as though it may have increased in size slightly and recommended follow-up exam.  Past Medical History:  Diagnosis Date   Cataract    per pt   Hypertension    Sleep apnea     Past Surgical History:  Procedure Laterality Date   CATARACT EXTRACTION, BILATERAL Bilateral    CHOLECYSTECTOMY     COLONOSCOPY  05/31/2013   lumbar HNP     SKIN CANCER EXCISION     nose    Family History  Problem Relation Age of Onset   Asthma Other    Heart disease Other    Heart Problems Mother    Colon cancer Neg Hx    Colon polyps Neg Hx    Esophageal cancer Neg Hx    Rectal cancer Neg Hx    Stomach cancer Neg Hx     Social History   Socioeconomic History   Marital status: Married    Spouse name: Not on file   Number of children: Not on file   Years of education: Not on file   Highest education level: Not on file  Occupational History   Not on file  Tobacco Use   Smoking status: Former    Packs/day: 1.00    Years: 3.00    Pack years: 3.00    Types: Cigarettes    Quit date: 02/08/1966    Years since quitting: 54.4   Smokeless tobacco: Never  Vaping Use   Vaping Use:  Never used  Substance and Sexual Activity   Alcohol use: Yes    Comment: ocassional beer/rare 1-2 beers per year per patient/Rm   Drug use: No   Sexual activity: Not on file  Other Topics Concern   Not on file  Social History Narrative   Not on file   Social Determinants of Health   Financial Resource Strain: Low Risk    Difficulty of Paying Living Expenses: Not hard at all  Food Insecurity: No Food Insecurity   Worried About Charity fundraiser in the Last Year: Never true   Ran Out of Food in the Last Year: Never true  Transportation Needs: No Transportation Needs   Lack of Transportation (Medical): No   Lack of Transportation (Non-Medical): No  Physical Activity: Inactive   Days of Exercise per Week: 0 days   Minutes of Exercise per Session: 0 min  Stress: No Stress Concern Present   Feeling of Stress : Not at all  Social Connections: Moderately Isolated   Frequency of Communication with Friends and  Family: More than three times a week   Frequency of Social Gatherings with Friends and Family: More than three times a week   Attends Religious Services: Never   Marine scientist or Organizations: No   Attends Music therapist: Never   Marital Status: Married  Human resources officer Violence: Not At Risk   Fear of Current or Ex-Partner: No   Emotionally Abused: No   Physically Abused: No   Sexually Abused: No    Outpatient Medications Prior to Visit  Medication Sig Dispense Refill   aspirin 81 MG tablet Take 81 mg by mouth daily.     lisinopril-hydrochlorothiazide (ZESTORETIC) 20-12.5 MG tablet Take 0.5 tablets by mouth daily. 90 tablet 1   No facility-administered medications prior to visit.    Allergies  Allergen Reactions   Penicillins Other (See Comments)    Unknown-told as child    ROS Review of Systems  Constitutional: Negative.   HENT: Negative.    Eyes:  Negative for photophobia and visual disturbance.  Respiratory:  Negative for cough,  chest tightness and wheezing.   Cardiovascular: Negative.   Gastrointestinal: Negative.   Genitourinary:  Negative for difficulty urinating, frequency and urgency.  Musculoskeletal:  Positive for arthralgias (mtps).  Skin:  Positive for wound. Negative for rash.  Neurological:  Negative for speech difficulty and headaches.  Psychiatric/Behavioral: Negative.       Objective:    Physical Exam Vitals and nursing note reviewed.  Constitutional:      General: He is not in acute distress.    Appearance: Normal appearance. He is not ill-appearing, toxic-appearing or diaphoretic.  HENT:     Head: Normocephalic and atraumatic.     Right Ear: Tympanic membrane, ear canal and external ear normal.     Left Ear: Tympanic membrane, ear canal and external ear normal.     Mouth/Throat:     Mouth: Mucous membranes are moist.     Pharynx: Oropharynx is clear. No oropharyngeal exudate or posterior oropharyngeal erythema.  Eyes:     General: No scleral icterus.       Right eye: No discharge.        Left eye: No discharge.     Extraocular Movements: Extraocular movements intact.     Conjunctiva/sclera: Conjunctivae normal.     Pupils: Pupils are equal, round, and reactive to light.  Neck:     Vascular: No carotid bruit.  Cardiovascular:     Rate and Rhythm: Normal rate and regular rhythm.  Pulmonary:     Effort: Pulmonary effort is normal.     Breath sounds: Normal breath sounds.  Abdominal:     General: Abdomen is flat. Bowel sounds are normal. There is no distension.     Palpations: Abdomen is soft. There is no mass.     Tenderness: There is no abdominal tenderness. There is no guarding or rebound.     Hernia: No hernia is present.  Musculoskeletal:     Cervical back: No rigidity or tenderness.     Right lower leg: No edema.     Left lower leg: No edema.  Lymphadenopathy:     Cervical: No cervical adenopathy.  Skin:    General: Skin is warm and dry.       Neurological:     Mental  Status: He is alert.  Psychiatric:        Mood and Affect: Mood normal.        Behavior: Behavior normal.    BP Marland Kitchen)  154/82   Pulse 73   Temp 97.8 F (36.6 C) (Temporal)   Ht 5' 11"  (1.803 m)   Wt 193 lb 12.8 oz (87.9 kg)   SpO2 96%   BMI 27.03 kg/m  Wt Readings from Last 3 Encounters:  07/16/20 193 lb 12.8 oz (87.9 kg)  09/19/19 190 lb 12.8 oz (86.5 kg)  06/12/19 190 lb (86.2 kg)     Health Maintenance Due  Topic Date Due   Hepatitis C Screening  Never done   Zoster Vaccines- Shingrix (1 of 2) Never done   COVID-19 Vaccine (3 - Pfizer risk series) 05/09/2019    There are no preventive care reminders to display for this patient.  Lab Results  Component Value Date   TSH 1.41 10/24/2017   Lab Results  Component Value Date   WBC 5.0 07/16/2020   HGB 14.2 07/16/2020   HCT 43.1 07/16/2020   MCV 87.6 07/16/2020   PLT 194.0 07/16/2020   Lab Results  Component Value Date   NA 138 07/16/2020   K 3.9 07/16/2020   CO2 27 07/16/2020   GLUCOSE 98 07/16/2020   BUN 16 07/16/2020   CREATININE 0.93 07/16/2020   BILITOT 1.0 07/16/2020   ALKPHOS 47 07/16/2020   AST 20 07/16/2020   ALT 14 07/16/2020   PROT 7.2 07/16/2020   ALBUMIN 4.4 07/16/2020   CALCIUM 8.7 07/16/2020   EGFR 88 05/02/2020   GFR 81.46 07/16/2020   Lab Results  Component Value Date   CHOL 154 07/16/2020   Lab Results  Component Value Date   HDL 36.00 (L) 07/16/2020   Lab Results  Component Value Date   LDLCALC 82 06/12/2019   Lab Results  Component Value Date   TRIG 222.0 (H) 07/16/2020   Lab Results  Component Value Date   CHOLHDL 4 07/16/2020   Lab Results  Component Value Date   HGBA1C 5.8 05/16/2008      Assessment & Plan:   Problem List Items Addressed This Visit       Cardiovascular and Mediastinum   Essential hypertension   Relevant Medications   lisinopril-hydrochlorothiazide (ZESTORETIC) 20-12.5 MG tablet   Other Relevant Orders   CBC (Completed)   Comprehensive  metabolic panel (Completed)   Urinalysis, Routine w reflex microscopic (Completed)     Musculoskeletal and Integument   Hammer toes of both feet - Primary   Tick bite of abdominal wall     Other   BPH associated with nocturia   Relevant Orders   PSA (Completed)   Elevated triglycerides with high cholesterol   Relevant Medications   lisinopril-hydrochlorothiazide (ZESTORETIC) 20-12.5 MG tablet   Other Relevant Orders   LDL cholesterol, direct (Completed)   Lipid panel (Completed)   Pulmonary nodule, left   Relevant Orders   CT Chest Wo Contrast   Abnormal findings on diagnostic imaging of lung   Relevant Orders   CT Chest Wo Contrast    Meds ordered this encounter  Medications   lisinopril-hydrochlorothiazide (ZESTORETIC) 20-12.5 MG tablet    Sig: Take 1 tablet by mouth daily.    Dispense:  90 tablet    Refill:  1    Follow-up: Return in about 3 months (around 10/16/2020).  Will go to the shoe marked and purchase shoes that fit his feet.  Suggested that he use a Dr. Felicie Morn pad in between his third and fourth toes.  Declines podiatry referral for now.  Follow-up in 3 months if not improving.  Agrees to go  for repeat CT scan in 6 months to follow left pulmonary nodule.  Increase lisinopril to 1 full tablet daily.  Tick bite was 2 weeks ago he would have developed tick fever by now.  Recommended reassurance.  Libby Maw, MD

## 2020-07-18 ENCOUNTER — Ambulatory Visit (HOSPITAL_BASED_OUTPATIENT_CLINIC_OR_DEPARTMENT_OTHER): Payer: Medicare Other

## 2020-07-18 ENCOUNTER — Other Ambulatory Visit: Payer: Medicare Other

## 2020-07-31 ENCOUNTER — Ambulatory Visit
Admission: RE | Admit: 2020-07-31 | Discharge: 2020-07-31 | Disposition: A | Discharge status: Home / Self Care | Payer: Medicare Other | Source: Ambulatory Visit | Attending: Family Medicine | Admitting: Family Medicine

## 2020-07-31 DIAGNOSIS — I712 Thoracic aortic aneurysm, without rupture: Secondary | ICD-10-CM | POA: Diagnosis not present

## 2020-07-31 DIAGNOSIS — R918 Other nonspecific abnormal finding of lung field: Secondary | ICD-10-CM

## 2020-07-31 DIAGNOSIS — R911 Solitary pulmonary nodule: Secondary | ICD-10-CM

## 2020-07-31 DIAGNOSIS — Z85828 Personal history of other malignant neoplasm of skin: Secondary | ICD-10-CM | POA: Diagnosis not present

## 2020-07-31 DIAGNOSIS — I2510 Atherosclerotic heart disease of native coronary artery without angina pectoris: Secondary | ICD-10-CM | POA: Diagnosis not present

## 2020-07-31 DIAGNOSIS — J984 Other disorders of lung: Secondary | ICD-10-CM | POA: Diagnosis not present

## 2020-09-11 DIAGNOSIS — Z961 Presence of intraocular lens: Secondary | ICD-10-CM | POA: Diagnosis not present

## 2020-09-11 DIAGNOSIS — H524 Presbyopia: Secondary | ICD-10-CM | POA: Diagnosis not present

## 2020-09-11 DIAGNOSIS — H26492 Other secondary cataract, left eye: Secondary | ICD-10-CM | POA: Diagnosis not present

## 2020-09-23 ENCOUNTER — Ambulatory Visit (INDEPENDENT_AMBULATORY_CARE_PROVIDER_SITE_OTHER): Payer: Medicare Other | Admitting: *Deleted

## 2020-09-23 DIAGNOSIS — Z0000 Encounter for general adult medical examination without abnormal findings: Secondary | ICD-10-CM | POA: Diagnosis not present

## 2020-09-23 NOTE — Patient Instructions (Signed)
Randall Kirby , Thank you for taking time to come for your Medicare Wellness Visit. I appreciate your ongoing commitment to your health goals. Please review the following plan we discussed and let me know if I can assist you in the future.   Screening recommendations/referrals: Colonoscopy: up to date Recommended yearly ophthalmology/optometry visit for glaucoma screening and checkup Recommended yearly dental visit for hygiene and checkup  Vaccinations: Influenza vaccine: Education provided Pneumococcal vaccine: up to date Tdap vaccine: up to date Shingles vaccine: Education provided    Advanced directives: Education provided  Conditions/risks identified:    Next appointment:   Preventive Care 74 Years and Older, Male Preventive care refers to lifestyle choices and visits with your health care provider that can promote health and wellness. What does preventive care include? A yearly physical exam. This is also called an annual well check. Dental exams once or twice a year. Routine eye exams. Ask your health care provider how often you should have your eyes checked. Personal lifestyle choices, including: Daily care of your teeth and gums. Regular physical activity. Eating a healthy diet. Avoiding tobacco and drug use. Limiting alcohol use. Practicing safe sex. Taking low doses of aspirin every day. Taking vitamin and mineral supplements as recommended by your health care provider. What happens during an annual well check? The services and screenings done by your health care provider during your annual well check will depend on your age, overall health, lifestyle risk factors, and family history of disease. Counseling  Your health care provider may ask you questions about your: Alcohol use. Tobacco use. Drug use. Emotional well-being. Home and relationship well-being. Sexual activity. Eating habits. History of falls. Memory and ability to understand (cognition). Work and  work Statistician. Screening  You may have the following tests or measurements: Height, weight, and BMI. Blood pressure. Lipid and cholesterol levels. These may be checked every 5 years, or more frequently if you are over 75 years old. Skin check. Lung cancer screening. You may have this screening every year starting at age 29 if you have a 30-pack-year history of smoking and currently smoke or have quit within the past 15 years. Fecal occult blood test (FOBT) of the stool. You may have this test every year starting at age 68. Flexible sigmoidoscopy or colonoscopy. You may have a sigmoidoscopy every 5 years or a colonoscopy every 10 years starting at age 85. Prostate cancer screening. Recommendations will vary depending on your family history and other risks. Hepatitis C blood test. Hepatitis B blood test. Sexually transmitted disease (STD) testing. Diabetes screening. This is done by checking your blood sugar (glucose) after you have not eaten for a while (fasting). You may have this done every 1-3 years. Abdominal aortic aneurysm (AAA) screening. You may need this if you are a current or former smoker. Osteoporosis. You may be screened starting at age 17 if you are at high risk. Talk with your health care provider about your test results, treatment options, and if necessary, the need for more tests. Vaccines  Your health care provider may recommend certain vaccines, such as: Influenza vaccine. This is recommended every year. Tetanus, diphtheria, and acellular pertussis (Tdap, Td) vaccine. You may need a Td booster every 10 years. Zoster vaccine. You may need this after age 42. Pneumococcal 13-valent conjugate (PCV13) vaccine. One dose is recommended after age 37. Pneumococcal polysaccharide (PPSV23) vaccine. One dose is recommended after age 73. Talk to your health care provider about which screenings and vaccines you need and  how often you need them. This information is not intended to  replace advice given to you by your health care provider. Make sure you discuss any questions you have with your health care provider. Document Released: 02/21/2015 Document Revised: 10/15/2015 Document Reviewed: 11/26/2014 Elsevier Interactive Patient Education  2017 [place] Prevention in the Home Falls can cause injuries. They can happen to people of all ages. There are many things you can do to make your home safe and to help prevent falls. What can I do on the outside of my home? Regularly fix the edges of walkways and driveways and fix any cracks. Remove anything that might make you trip as you walk through a door, such as a raised step or threshold. Trim any bushes or trees on the path to your home. Use bright outdoor lighting. Clear any walking paths of anything that might make someone trip, such as rocks or tools. Regularly check to see if handrails are loose or broken. Make sure that both sides of any steps have handrails. Any raised decks and porches should have guardrails on the edges. Have any leaves, snow, or ice cleared regularly. Use sand or salt on walking paths during winter. Clean up any spills in your garage right away. This includes oil or grease spills. What can I do in the bathroom? Use night lights. Install grab bars by the toilet and in the tub and shower. Do not use towel bars as grab bars. Use non-skid mats or decals in the tub or shower. If you need to sit down in the shower, use a plastic, non-slip stool. Keep the floor dry. Clean up any water that spills on the floor as soon as it happens. Remove soap buildup in the tub or shower regularly. Attach bath mats securely with double-sided non-slip rug tape. Do not have throw rugs and other things on the floor that can make you trip. What can I do in the bedroom? Use night lights. Make sure that you have a light by your bed that is easy to reach. Do not use any sheets or blankets that are too big for  your bed. They should not hang down onto the floor. Have a firm chair that has side arms. You can use this for support while you get dressed. Do not have throw rugs and other things on the floor that can make you trip. What can I do in the kitchen? Clean up any spills right away. Avoid walking on wet floors. Keep items that you use a lot in easy-to-reach places. If you need to reach something above you, use a strong step stool that has a grab bar. Keep electrical cords out of the way. Do not use floor polish or wax that makes floors slippery. If you must use wax, use non-skid floor wax. Do not have throw rugs and other things on the floor that can make you trip. What can I do with my stairs? Do not leave any items on the stairs. Make sure that there are handrails on both sides of the stairs and use them. Fix handrails that are broken or loose. Make sure that handrails are as long as the stairways. Check any carpeting to make sure that it is firmly attached to the stairs. Fix any carpet that is loose or worn. Avoid having throw rugs at the top or bottom of the stairs. If you do have throw rugs, attach them to the floor with carpet tape. Make sure that you have  a light switch at the top of the stairs and the bottom of the stairs. If you do not have them, ask someone to add them for you. What else can I do to help prevent falls? Wear shoes that: Do not have high heels. Have rubber bottoms. Are comfortable and fit you well. Are closed at the toe. Do not wear sandals. If you use a stepladder: Make sure that it is fully opened. Do not climb a closed stepladder. Make sure that both sides of the stepladder are locked into place. Ask someone to hold it for you, if possible. Clearly mark and make sure that you can see: Any grab bars or handrails. First and last steps. Where the edge of each step is. Use tools that help you move around (mobility aids) if they are needed. These  include: Canes. Walkers. Scooters. Crutches. Turn on the lights when you go into a dark area. Replace any light bulbs as soon as they burn out. Set up your furniture so you have a clear path. Avoid moving your furniture around. If any of your floors are uneven, fix them. If there are any pets around you, be aware of where they are. Review your medicines with your doctor. Some medicines can make you feel dizzy. This can increase your chance of falling. Ask your doctor what other things that you can do to help prevent falls. This information is not intended to replace advice given to you by your health care provider. Make sure you discuss any questions you have with your health care provider. Document Released: 11/21/2008 Document Revised: 07/03/2015 Document Reviewed: 03/01/2014 Elsevier Interactive Patient Education  2017 [employer].

## 2020-09-23 NOTE — Progress Notes (Signed)
Subjective:   Randall Kirby is a 75 y.o. male who presents for Medicare Annual/Subsequent preventive examination. I connected with  Randall Kirby on 09/23/20 by  telephone enabled telemedicine application and verified that I am speaking with the correct person using two identifiers.   I discussed the limitations of evaluation and management by telemedicine. The patient expressed understanding and agreed to proceed.  Patient location: home    Review of Systems    NA Cardiac Risk Factors include: advanced age (>44mn, >>20women);hypertension;male gender     Objective:    Today's Vitals   There is no height or weight on file to calculate BMI.  Advanced Directives 09/23/2020 09/19/2019  Does Patient Have a Medical Advance Directive? No No  Would patient like information on creating a medical advance directive? No - Patient declined No - Patient declined    Current Medications (verified) Outpatient Encounter Medications as of 09/23/2020  Medication Sig   lisinopril-hydrochlorothiazide (ZESTORETIC) 20-12.5 MG tablet Take 1 tablet by mouth daily.   aspirin 81 MG tablet Take 81 mg by mouth daily. (Patient not taking: Reported on 09/23/2020)   No facility-administered encounter medications on file as of 09/23/2020.    Allergies (verified) Penicillins   History: Past Medical History:  Diagnosis Date   Cataract    per pt   Hypertension    Sleep apnea    Past Surgical History:  Procedure Laterality Date   CATARACT EXTRACTION, BILATERAL Bilateral    CHOLECYSTECTOMY     COLONOSCOPY  05/31/2013   lumbar HNP     SKIN CANCER EXCISION     nose   Family History  Problem Relation Age of Onset   Asthma Other    Heart disease Other    Heart Problems Mother    Colon cancer Neg Hx    Colon polyps Neg Hx    Esophageal cancer Neg Hx    Rectal cancer Neg Hx    Stomach cancer Neg Hx    Social History   Socioeconomic History   Marital status: Married    Spouse name:  Not on file   Number of children: Not on file   Years of education: Not on file   Highest education level: Not on file  Occupational History   Not on file  Tobacco Use   Smoking status: Former    Packs/day: 1.00    Years: 3.00    Pack years: 3.00    Types: Cigarettes    Quit date: 02/08/1966    Years since quitting: 54.6   Smokeless tobacco: Never  Vaping Use   Vaping Use: Never used  Substance and Sexual Activity   Alcohol use: Yes    Comment: ocassional beer/rare 1-2 beers per year per patient/Rm   Drug use: No   Sexual activity: Not on file  Other Topics Concern   Not on file  Social History Narrative   Not on file   Social Determinants of Health   Financial Resource Strain: Low Risk    Difficulty of Paying Living Expenses: Not hard at all  Food Insecurity: No Food Insecurity   Worried About RCharity fundraiserin the Last Year: Never true   Ran Out of Food in the Last Year: Never true  Transportation Needs: Not on file  Physical Activity: Not on file  Stress: No Stress Concern Present   Feeling of Stress : Not at all  Social Connections: Moderately Integrated   Frequency of Communication with Friends and  Family: Three times a week   Frequency of Social Gatherings with Friends and Family: Three times a week   Attends Religious Services: 1 to 4 times per year   Active Member of Clubs or Organizations: No   Attends Archivist Meetings: Never   Marital Status: Married    Tobacco Counseling Counseling given: Not Answered   Clinical Intake:  Pre-visit preparation completed: Yes  Pain : No/denies pain     Nutritional Risks: None Diabetes: No  How often do you need to have someone help you when you read instructions, pamphlets, or other written materials from your doctor or pharmacy?: 1 - Never  Diabetic?NO  Interpreter Needed?: No  Information entered by :: Leroy Kennedy LPN   Activities of Daily Living In your present state of health, do you  have any difficulty performing the following activities: 09/23/2020  Hearing? N  Vision? N  Difficulty concentrating or making decisions? N  Walking or climbing stairs? N  Dressing or bathing? N  Doing errands, shopping? N  Preparing Food and eating ? N  Using the Toilet? N  In the past six months, have you accidently leaked urine? N  Do you have problems with loss of bowel control? N  Managing your Medications? N  Housekeeping or managing your Housekeeping? N  Some recent data might be hidden    Patient Care Team: Libby Maw, MD as PCP - General (Family Medicine) Shon Hough, MD as Consulting Physician (Ophthalmology)  Indicate any recent Medical Services you may have received from other than [facility] providers in the past year (date may be approximate).     Assessment:   This is a routine wellness examination for Randall Kirby.  Hearing/Vision screen Hearing Screening - Comments:: No trouble hearing  Vision Screening - Comments:: Up to date [facility]   Dietary issues and exercise activities discussed: Current Exercise Habits: Home exercise routine, Type of exercise: walking (mowing yard ..), Time (Minutes): 30, Frequency (Times/Week): 3, Weekly Exercise (Minutes/Week): 90, Intensity: Moderate   Goals Addressed             This Visit's Progress    Patient Stated       Randall Kirby working on car       Depression Screen PHQ 2/9 Scores 07/16/2020 09/19/2019 06/12/2019 06/12/2019 01/09/2019 05/14/2015 04/16/2014  PHQ - 2 Score 0 0 0 0 0 0 0  PHQ- 9 Score - - 0 - - - -    Fall Risk Fall Risk  09/23/2020 07/16/2020 09/19/2019 06/12/2019 01/09/2019  Falls in the past year? 0 0 0 0 0  Comment - - - - -  Number falls in past yr: 0 - 0 - 0  Injury with Fall? 0 - 0 - 0  Follow up Falls evaluation completed;Falls prevention discussed - Falls prevention discussed - Falls evaluation completed    FALL RISK PREVENTION PERTAINING TO THE HOME:  Any stairs in or around the  home? No  If so, are there any without handrails? No  Home free of loose throw rugs in walkways, pet beds, electrical cords, etc? Yes  Adequate lighting in your home to reduce risk of falls? Yes   ASSISTIVE DEVICES UTILIZED TO PREVENT FALLS:  Life alert? No  Use of a cane, walker or w/c? No  Grab bars in the bathroom? Yes  Shower chair or bench in shower? Yes  Elevated toilet seat or a handicapped toilet? No   TIMED UP AND GO:  Was the test performed?  No . COMFORT LEVEL IN ONE BATHROOM  Cognitive Function:  Normal cognitive status assessed by direct observation by this Nurse Health Advisor. No abnormalities found.          Immunizations Immunization History  Administered Date(s) Administered   Influenza Split 02/08/2012   Influenza, High Dose Seasonal PF 01/19/2016, 10/19/2016, 10/24/2017   PFIZER(Purple Top)SARS-COV-2 Vaccination 03/19/2019, 04/11/2019   Pneumococcal Conjugate-13 04/16/2014   Pneumococcal Polysaccharide-23 06/13/2008, 05/14/2015   Td 02/09/1995, 06/13/2008   Tdap 04/15/2015   Zoster, Live 12/20/2012    TDAP status: Up to date  Flu Vaccine status: Due, Education has been provided regarding the importance of this vaccine. Advised may receive this vaccine at local pharmacy or Health Dept. Aware to provide a copy of the vaccination record if obtained from local pharmacy or Health Dept. Verbalized acceptance and understanding.  Pneumococcal vaccine status: Up to date  Covid-19 vaccine status: Information provided on how to obtain vaccines.   Qualifies for Shingles Vaccine? Yes   Zostavax completed Yes   Shingrix Completed?: No.    Education has been provided regarding the importance of this vaccine. Patient has been advised to call insurance company to determine out of pocket expense if they have not yet received this vaccine. Advised may also receive vaccine at local pharmacy or Health Dept. Verbalized acceptance and understanding.  Screening  Tests Health Maintenance  Topic Date Due   Hepatitis C Screening  Never done   Zoster Vaccines- Shingrix (1 of 2) Never done   COVID-19 Vaccine (3 - Pfizer risk series) 05/09/2019   INFLUENZA VACCINE  09/08/2020   COLONOSCOPY (Pts 45-43yr Insurance coverage will need to be confirmed)  04/29/2024   TETANUS/TDAP  04/14/2025   PNA vac Low Risk Adult  Completed   HPV VACCINES  Aged Out    Health Maintenance  Health Maintenance Due  Topic Date Due   Hepatitis C Screening  Never done   Zoster Vaccines- Shingrix (1 of 2) Never done   COVID-19 Vaccine (3 - Pfizer risk series) 05/09/2019   INFLUENZA VACCINE  09/08/2020    Colorectal cancer screening: Type of screening: Colonoscopy. Completed 2021. Repeat every no longer required years  Lung Cancer Screening: (Low Dose CT Chest recommended if Age 74-80years, 30 pack-year currently smoking OR have quit w/in 15years.) does qualify.   Lung Cancer Screening Referral:   Additional Screening:  Hepatitis C Screening: does qualify; Completed   Vision Screening: Recommended annual ophthalmology exams for early detection of glaucoma and other disorders of the eye. Is the patient up to date with their annual eye exam?  Yes  Who is the provider or what is the name of the [facility] the [facility]  If pt is not established with a provider, would they like to be referred to a provider to establish care? No .   Dental Screening: Recommended annual dental exams for proper oral hygiene  Community Resource Referral / Chronic Care Management: CRR required this visit?  No   CCM required this visit?  No      Plan:     I have personally reviewed and noted the following in the patient's chart:   Medical and social history Use of alcohol, tobacco or illicit drugs  Current medications and supplements including opioid prescriptions. Patient is not currently taking opioid  prescriptions. Functional ability and status Nutritional status Physical activity Advanced directives List of other physicians Hospitalizations, surgeries, and ER visits in previous 12 months  Vitals Screenings to include cognitive, depression, and falls Referrals and appointments  In addition, I have reviewed and discussed with patient certain preventive protocols, quality metrics, and best practice recommendations. A written personalized care plan for preventive services as well as general preventive health recommendations were provided to patient.     Leroy Kennedy, LPN   [id number]   Nurse Notes: na

## 2020-09-24 ENCOUNTER — Ambulatory Visit: Payer: Medicare Other

## 2020-10-20 ENCOUNTER — Ambulatory Visit: Payer: Medicare Other | Admitting: Family Medicine

## 2020-12-17 DIAGNOSIS — Z23 Encounter for immunization: Secondary | ICD-10-CM | POA: Diagnosis not present

## 2021-01-06 ENCOUNTER — Ambulatory Visit (INDEPENDENT_AMBULATORY_CARE_PROVIDER_SITE_OTHER): Payer: Medicare Other | Admitting: Family Medicine

## 2021-01-06 ENCOUNTER — Encounter: Payer: Self-pay | Admitting: Family Medicine

## 2021-01-06 ENCOUNTER — Other Ambulatory Visit: Payer: Self-pay

## 2021-01-06 VITALS — BP 144/70 | HR 65 | Temp 97.4°F | Ht 71.0 in | Wt 188.4 lb

## 2021-01-06 DIAGNOSIS — I7090 Unspecified atherosclerosis: Secondary | ICD-10-CM | POA: Diagnosis not present

## 2021-01-06 DIAGNOSIS — E782 Mixed hyperlipidemia: Secondary | ICD-10-CM | POA: Diagnosis not present

## 2021-01-06 DIAGNOSIS — I10 Essential (primary) hypertension: Secondary | ICD-10-CM

## 2021-01-06 LAB — LIPID PANEL
Cholesterol: 157 mg/dL (ref 0–200)
HDL: 33.4 mg/dL — ABNORMAL LOW (ref >39.00)
LDL Cholesterol: 92 mg/dL (ref 0–99)
NonHDL: 124.04
Total CHOL/HDL Ratio: 5
Triglycerides: 160 mg/dL — ABNORMAL HIGH (ref 0.0–149.0)
VLDL: 32 mg/dL (ref 0.0–40.0)

## 2021-01-06 LAB — BASIC METABOLIC PANEL
BUN: 17 mg/dL (ref 6–23)
CO2: 27 mEq/L (ref 19–32)
Calcium: 9.3 mg/dL (ref 8.4–10.5)
Chloride: 102 mEq/L (ref 96–112)
Creatinine, Ser: 1.01 mg/dL (ref 0.40–1.50)
GFR: 73.53 mL/min (ref >60.00)
Glucose, Bld: 103 mg/dL — ABNORMAL HIGH (ref 70–99)
Potassium: 3.8 mEq/L (ref 3.5–5.1)
Sodium: 137 mEq/L (ref 135–145)

## 2021-01-06 LAB — LDL CHOLESTEROL, DIRECT: Direct LDL: 94 mg/dL

## 2021-01-06 MED ORDER — LISINOPRIL-HYDROCHLOROTHIAZIDE 20-25 MG PO TABS: 1.0000 | ORAL_TABLET | Freq: Every day | ORAL | 3 refills | Status: DC

## 2021-01-06 MED ORDER — ATORVASTATIN CALCIUM 10 MG PO TABS
10.0000 mg | ORAL_TABLET | Freq: Every day | ORAL | 3 refills | Status: DC
Start: 2021-01-06 — End: 2021-07-23

## 2021-01-06 NOTE — Progress Notes (Signed)
Established Patient Office Visit  Subjective:  Patient ID: Randall Kirby, male    DOB: 1945/09/03  Age: 75 y.o. MRN: [id number]  CC:  Chief Complaint  Patient presents with   Follow-up    Follow up on labs and discuss CT, patient fasting.     HPI Randall Kirby presents for follow-up of hypertension, elevated cholesterol, calcified coronary arteries, stable thoracic aneurysm and stable pulmonary nodule.  Is now followed by vascular surgery.  Will have follow-up study.  He is having no chest pain shortness of breath or DOE.  Quit smoking over 30 years ago.  Brings in a list of blood pressures that are running from 1 32-1 50s over 60s to 70s.  Most of the pressures were on towards the 150 range.  Past Medical History:  Diagnosis Date   Cataract    per pt   Hypertension    Sleep apnea     Past Surgical History:  Procedure Laterality Date   CATARACT EXTRACTION, BILATERAL Bilateral    CHOLECYSTECTOMY     COLONOSCOPY  05/31/2013   lumbar HNP     SKIN CANCER EXCISION     nose    Family History  Problem Relation Age of Onset   Asthma Other    Heart disease Other    Heart Problems Mother    Colon cancer Neg Hx    Colon polyps Neg Hx    Esophageal cancer Neg Hx    Rectal cancer Neg Hx    Stomach cancer Neg Hx     Social History   Socioeconomic History   Marital status: Married    Spouse name: Not on file   Number of children: Not on file   Years of education: Not on file   Highest education level: Not on file  Occupational History   Not on file  Tobacco Use   Smoking status: Former    Packs/day: 1.00    Years: 3.00    Pack years: 3.00    Types: Cigarettes    Quit date: 02/08/1966    Years since quitting: 54.9   Smokeless tobacco: Never  Vaping Use   Vaping Use: Never used  Substance and Sexual Activity   Alcohol use: Yes    Comment: ocassional beer/rare 1-2 beers per year per patient/Rm   Drug use: No   Sexual activity: Not on file  Other Topics  Concern   Not on file  Social History Narrative   Not on file   Social Determinants of Health   Financial Resource Strain: Low Risk    Difficulty of Paying Living Expenses: Not hard at all  Food Insecurity: No Food Insecurity   Worried About Charity fundraiser in the Last Year: Never true   Ran Out of Food in the Last Year: Never true  Transportation Needs: Not on file  Physical Activity: Not on file  Stress: No Stress Concern Present   Feeling of Stress : Not at all  Social Connections: Moderately Integrated   Frequency of Communication with Friends and Family: Three times a week   Frequency of Social Gatherings with Friends and Family: Three times a week   Attends Religious Services: 1 to 4 times per year   Active Member of Clubs or Organizations: No   Attends Archivist Meetings: Never   Marital Status: Married  Human resources officer Violence: Not on file    Outpatient Medications Prior to Visit  Medication Sig Dispense Refill   aspirin  81 MG tablet Take 81 mg by mouth daily.     lisinopril-hydrochlorothiazide (ZESTORETIC) 20-12.5 MG tablet Take 1 tablet by mouth daily. 90 tablet 1   No facility-administered medications prior to visit.    Allergies  Allergen Reactions   Penicillins Other (See Comments)    Unknown-told as child    ROS Review of Systems  Constitutional:  Negative for chills, diaphoresis, fatigue, fever and unexpected weight change.  HENT: Negative.    Eyes:  Negative for photophobia and visual disturbance.  Respiratory:  Negative for chest tightness, shortness of breath and wheezing.   Cardiovascular:  Negative for chest pain and palpitations.  Gastrointestinal: Negative.   Genitourinary: Negative.   Neurological:  Negative for light-headedness and headaches.  Psychiatric/Behavioral: Negative.       Objective:    Physical Exam Vitals and nursing note reviewed.  Constitutional:      General: He is not in acute distress.    Appearance:  Normal appearance. He is not ill-appearing, toxic-appearing or diaphoretic.  HENT:     Head: Normocephalic and atraumatic.     Right Ear: Tympanic membrane, ear canal and external ear normal.     Left Ear: Tympanic membrane, ear canal and external ear normal.     Mouth/Throat:     Mouth: Mucous membranes are moist.     Pharynx: Oropharynx is clear. No oropharyngeal exudate or posterior oropharyngeal erythema.  Eyes:     General: No scleral icterus.       Left eye: No discharge.     Extraocular Movements: Extraocular movements intact.     Conjunctiva/sclera: Conjunctivae normal.     Pupils: Pupils are equal, round, and reactive to light.  Neck:     Vascular: No carotid bruit.  Cardiovascular:     Rate and Rhythm: Normal rate and regular rhythm.  Pulmonary:     Effort: Pulmonary effort is normal. No respiratory distress.     Breath sounds: Normal breath sounds. No wheezing or rales.  Abdominal:     General: Bowel sounds are normal.  Musculoskeletal:     Cervical back: No rigidity or tenderness.  Lymphadenopathy:     Cervical: No cervical adenopathy.  Skin:    General: Skin is warm and dry.  Neurological:     General: No focal deficit present.     Mental Status: He is alert and oriented to person, place, and time.    BP (!) 144/70 (BP Location: Left Arm, Patient Position: Sitting, Cuff Size: Normal)   Pulse 65   Temp (!) 97.4 F (36.3 C) (Temporal)   Ht 5' 11"  (1.803 m)   Wt 188 lb 6.4 oz (85.5 kg)   SpO2 97%   BMI 26.28 kg/m  Wt Readings from Last 3 Encounters:  01/06/21 188 lb 6.4 oz (85.5 kg)  07/16/20 193 lb 12.8 oz (87.9 kg)  09/19/19 190 lb 12.8 oz (86.5 kg)     Health Maintenance Due  Topic Date Due   Hepatitis C Screening  Never done   Zoster Vaccines- Shingrix (1 of 2) Never done    There are no preventive care reminders to display for this patient.  Lab Results  Component Value Date   TSH 1.41 10/24/2017   Lab Results  Component Value Date   WBC  5.0 07/16/2020   HGB 14.2 07/16/2020   HCT 43.1 07/16/2020   MCV 87.6 07/16/2020   PLT 194.0 07/16/2020   Lab Results  Component Value Date   NA 138 07/16/2020  K 3.9 07/16/2020   CO2 27 07/16/2020   GLUCOSE 98 07/16/2020   BUN 16 07/16/2020   CREATININE 0.93 07/16/2020   BILITOT 1.0 07/16/2020   ALKPHOS 47 07/16/2020   AST 20 07/16/2020   ALT 14 07/16/2020   PROT 7.2 07/16/2020   ALBUMIN 4.4 07/16/2020   CALCIUM 8.7 07/16/2020   EGFR 88 05/02/2020   GFR 81.46 07/16/2020   Lab Results  Component Value Date   CHOL 154 07/16/2020   Lab Results  Component Value Date   HDL 36.00 (L) 07/16/2020   Lab Results  Component Value Date   LDLCALC 82 06/12/2019   Lab Results  Component Value Date   TRIG 222.0 (H) 07/16/2020   Lab Results  Component Value Date   CHOLHDL 4 07/16/2020   Lab Results  Component Value Date   HGBA1C 5.8 05/16/2008      Assessment & Plan:   Problem List Items Addressed This Visit       Cardiovascular and Mediastinum   Essential hypertension - Primary   Relevant Medications   lisinopril-hydrochlorothiazide (ZESTORETIC) 20-25 MG tablet   atorvastatin (LIPITOR) 10 MG tablet   Other Relevant Orders   Basic metabolic panel   Calcified atheromatous plaque   Relevant Medications   lisinopril-hydrochlorothiazide (ZESTORETIC) 20-25 MG tablet   atorvastatin (LIPITOR) 10 MG tablet   Other Relevant Orders   Lipid panel   LDL cholesterol, direct     Other   Mixed hyperlipidemia   Relevant Medications   lisinopril-hydrochlorothiazide (ZESTORETIC) 20-25 MG tablet   atorvastatin (LIPITOR) 10 MG tablet   Other Relevant Orders   Lipid panel   LDL cholesterol, direct    Meds ordered this encounter  Medications   lisinopril-hydrochlorothiazide (ZESTORETIC) 20-25 MG tablet    Sig: Take 1 tablet by mouth daily.    Dispense:  90 tablet    Refill:  3   atorvastatin (LIPITOR) 10 MG tablet    Sig: Take 1 tablet (10 mg total) by mouth daily.     Dispense:  90 tablet    Refill:  3    Follow-up: Return in about 3 months (around 04/07/2021).  Have increased Zestoretic to 20/25.  Have added low-dose Lipitor to lower LDL cholesterol.  He is fasting today.  Libby Maw, MD

## 2021-04-07 ENCOUNTER — Ambulatory Visit: Payer: Medicare Other | Admitting: Family Medicine

## 2021-04-16 ENCOUNTER — Ambulatory Visit (HOSPITAL_COMMUNITY)
Admission: RE | Admit: 2021-04-16 | Discharge: 2021-04-16 | Disposition: A | Discharge status: Home / Self Care | Payer: Medicare Other | Source: Ambulatory Visit | Attending: Cardiology | Admitting: Cardiology

## 2021-04-16 ENCOUNTER — Other Ambulatory Visit: Payer: Self-pay

## 2021-04-16 DIAGNOSIS — I712 Thoracic aortic aneurysm, without rupture, unspecified: Secondary | ICD-10-CM | POA: Diagnosis not present

## 2021-04-16 DIAGNOSIS — R911 Solitary pulmonary nodule: Secondary | ICD-10-CM | POA: Diagnosis not present

## 2021-04-16 DIAGNOSIS — I7121 Aneurysm of the ascending aorta, without rupture: Secondary | ICD-10-CM | POA: Diagnosis not present

## 2021-04-16 LAB — POCT I-STAT CREATININE: Creatinine, Ser: 1 mg/dL (ref 0.61–1.24)

## 2021-04-16 MED ORDER — IOHEXOL 350 MG/ML SOLN
100.0000 mL | Freq: Once | INTRAVENOUS | Status: AC | PRN
  Administered 2021-04-16: 13:00:00 100 mL via INTRAVENOUS

## 2021-04-16 MED ORDER — SODIUM CHLORIDE (PF) 0.9 % IJ SOLN
INTRAMUSCULAR | Status: AC
  Filled 2021-04-16: qty 50

## 2021-04-17 ENCOUNTER — Ambulatory Visit: Payer: Medicare Other | Admitting: Family Medicine

## 2021-04-24 DIAGNOSIS — D225 Melanocytic nevi of trunk: Secondary | ICD-10-CM | POA: Diagnosis not present

## 2021-04-24 DIAGNOSIS — I788 Other diseases of capillaries: Secondary | ICD-10-CM | POA: Diagnosis not present

## 2021-04-24 DIAGNOSIS — L570 Actinic keratosis: Secondary | ICD-10-CM | POA: Diagnosis not present

## 2021-04-24 DIAGNOSIS — L578 Other skin changes due to chronic exposure to nonionizing radiation: Secondary | ICD-10-CM | POA: Diagnosis not present

## 2021-04-24 DIAGNOSIS — L814 Other melanin hyperpigmentation: Secondary | ICD-10-CM | POA: Diagnosis not present

## 2021-04-24 DIAGNOSIS — Z85828 Personal history of other malignant neoplasm of skin: Secondary | ICD-10-CM | POA: Diagnosis not present

## 2021-04-24 DIAGNOSIS — D2261 Melanocytic nevi of right upper limb, including shoulder: Secondary | ICD-10-CM | POA: Diagnosis not present

## 2021-04-24 DIAGNOSIS — D1801 Hemangioma of skin and subcutaneous tissue: Secondary | ICD-10-CM | POA: Diagnosis not present

## 2021-04-24 DIAGNOSIS — L821 Other seborrheic keratosis: Secondary | ICD-10-CM | POA: Diagnosis not present

## 2021-07-20 ENCOUNTER — Encounter: Payer: Self-pay | Admitting: Pulmonary Disease

## 2021-07-20 ENCOUNTER — Ambulatory Visit (INDEPENDENT_AMBULATORY_CARE_PROVIDER_SITE_OTHER): Payer: Medicare Other | Admitting: Pulmonary Disease

## 2021-07-20 VITALS — BP 102/72 | HR 64 | Ht 71.0 in | Wt 182.4 lb

## 2021-07-20 DIAGNOSIS — G4733 Obstructive sleep apnea (adult) (pediatric): Secondary | ICD-10-CM

## 2021-07-20 NOTE — Patient Instructions (Signed)
DME referral for CPAP supplies  Continue using CPAP nightly  Regular exercises as tolerated  Follow-up in a year

## 2021-07-20 NOTE — Progress Notes (Signed)
Current Outpatient Medications on File Prior to Visit  Medication Sig   aspirin 81 MG tablet Take 81 mg by mouth daily.   atorvastatin (LIPITOR) 10 MG tablet Take 1 tablet (10 mg total) by mouth daily.   lisinopril-hydrochlorothiazide (ZESTORETIC) 20-12.5 MG tablet Take 1 tablet by mouth 1 day or 1 dose.   lisinopril-hydrochlorothiazide (ZESTORETIC) 20-25 MG tablet Take 1 tablet by mouth daily.   No current facility-administered medications on file prior to visit.     Chief Complaint  Patient presents with   Consult    Pt uses a CPAP, no SOB, hasn't been to office since 09/2016. States he just needs to follow up to get more machine supplies.    Continues to use CPAP on a nightly basis Has not been having any significant problems with his CPAP  Sleeps well at night Usually goes to bed about midnight, wakes up about 9 AM Wakes up feeling like he is at a good nights rest DME company is [employer]  CPAP set at [employer] works well Does not like using CPAP but has adjusted to using it  Denies any significant chest pains or chest discomfort History of premature atrial contraction-has been stable  Denies any unusual shortness of breath, no chest pain or chest discomfort He is functioning relatively well  Sleep tests PSG 04/09/03 >> AHI 32 CPAP 06/19/16 to 09/16/16 >> used on 90 of 90 nights with average 8 hrs 24 min.  Average AHI 4.2 with CPAP 5 cm H2O  Past medical history HTN  Past surgical history, Family history, Social history, Allergies all reviewed.  Vital Signs BP 102/72   Pulse 64   Ht '5\' 11"'$  (1.803 m)   Wt 182 lb 6.4 oz (82.7 kg)   SpO2 97%   BMI 25.44 kg/m   History of Present Illness Randall Kirby is a 76 y.o. male with OSA.  He uses CPAP nightly.  He has nasal pillows mask.  He gets air leaking from his tear ducts.  This causes dry eyes.    Physical Exam  General -in no acute distress moist oral mucosa ENT - No sinus tenderness, no oral exudate, no  LAN Cardiac - s1s2 regular, no murmur Chest -clear breath sounds Back - No focal tenderness Abd - Soft, non-tender Ext - No edema Neuro - Normal strength Skin - No rashes Psych - normal mood, and behavior   Assessment/Plan  Compliance -100% compliance -Average use of 7 hours 58 minutes -Machine set at 5 -Residual AHI of 11.6  Continue using CPAP nightly  Encourage 6 to 8 hours of sleep nightly  No changes need to be made to pressures at present  I will see you back in about a year  Call with significant concerns  DME referral for CPAP supplies  Sherrilyn Rist, MD [place] [facility] Pager: See Shea Evans

## 2021-07-23 ENCOUNTER — Encounter: Payer: Self-pay | Admitting: Family Medicine

## 2021-07-23 ENCOUNTER — Ambulatory Visit (INDEPENDENT_AMBULATORY_CARE_PROVIDER_SITE_OTHER): Payer: Medicare Other | Admitting: Family Medicine

## 2021-07-23 ENCOUNTER — Ambulatory Visit: Payer: Medicare Other | Admitting: Family Medicine

## 2021-07-23 DIAGNOSIS — E782 Mixed hyperlipidemia: Secondary | ICD-10-CM | POA: Diagnosis not present

## 2021-07-23 DIAGNOSIS — I7090 Unspecified atherosclerosis: Secondary | ICD-10-CM

## 2021-07-23 DIAGNOSIS — I10 Essential (primary) hypertension: Secondary | ICD-10-CM | POA: Diagnosis not present

## 2021-07-23 LAB — LIPID PANEL
Cholesterol: 85 mg/dL (ref 0–200)
HDL: 31.8 mg/dL — ABNORMAL LOW (ref >39.00)
LDL Cholesterol: 38 mg/dL (ref 0–99)
NonHDL: 53.27
Total CHOL/HDL Ratio: 3
Triglycerides: 76 mg/dL (ref 0.0–149.0)
VLDL: 15.2 mg/dL (ref 0.0–40.0)

## 2021-07-23 LAB — COMPREHENSIVE METABOLIC PANEL
ALT: 14 U/L (ref 0–53)
AST: 19 U/L (ref 0–37)
Albumin: 4.2 g/dL (ref 3.5–5.2)
Alkaline Phosphatase: 43 U/L (ref 39–117)
BUN: 15 mg/dL (ref 6–23)
CO2: 28 mEq/L (ref 19–32)
Calcium: 8.9 mg/dL (ref 8.4–10.5)
Chloride: 103 mEq/L (ref 96–112)
Creatinine, Ser: 0.88 mg/dL (ref 0.40–1.50)
GFR: 84.69 mL/min (ref >60.00)
Glucose, Bld: 102 mg/dL — ABNORMAL HIGH (ref 70–99)
Potassium: 3.5 mEq/L (ref 3.5–5.1)
Sodium: 139 mEq/L (ref 135–145)
Total Bilirubin: 0.9 mg/dL (ref 0.2–1.2)
Total Protein: 7.2 g/dL (ref 6.0–8.3)

## 2021-07-23 MED ORDER — LISINOPRIL-HYDROCHLOROTHIAZIDE 20-25 MG PO TABS: 1.0000 | ORAL_TABLET | Freq: Every day | ORAL | 3 refills | Status: DC

## 2021-07-23 MED ORDER — ATORVASTATIN CALCIUM 10 MG PO TABS: 10.0000 mg | ORAL_TABLET | Freq: Every day | ORAL | 3 refills | Status: DC

## 2021-07-23 NOTE — Progress Notes (Signed)
Established Patient Office Visit  Subjective   Patient ID: Randall Kirby, male    DOB: 03/21/1945  Age: 76 y.o. MRN: [id number]  Chief Complaint  Patient presents with   Follow-up    Follow up on labs patient fasting for labs.     HPI follow-up of hypertension status post increased dose of Zestoretic and ASCVD score with calcification of arteries.  Blood pressure under much better control with a higher dose of Zestoretic.  Seems to be tolerating atorvastatin over the last 3 months.  Has occasional brief muscle aches and pains after he has been doing a lot of work on his cars around yard.    Review of Systems  Constitutional: Negative.   HENT: Negative.    Eyes:  Negative for blurred vision, discharge and redness.  Respiratory: Negative.    Cardiovascular: Negative.   Gastrointestinal:  Negative for abdominal pain.  Genitourinary: Negative.   Musculoskeletal: Negative.  Negative for myalgias.  Skin:  Negative for rash.  Neurological:  Negative for dizziness, tingling, loss of consciousness and weakness.  Endo/Heme/Allergies:  Negative for polydipsia.      Objective:     BP 136/68 (BP Location: Right Arm, Patient Position: Sitting, Cuff Size: Normal)   Pulse 64   Temp (!) 97.5 F (36.4 C) (Temporal)   Ht '5\' 11"'$  (1.803 m)   Wt 183 lb 6.4 oz (83.2 kg)   SpO2 96%   BMI 25.58 kg/m    Physical Exam Constitutional:      General: He is not in acute distress.    Appearance: Normal appearance. He is not ill-appearing, toxic-appearing or diaphoretic.  HENT:     Head: Normocephalic and atraumatic.     Right Ear: External ear normal.     Left Ear: External ear normal.     Mouth/Throat:     Mouth: Mucous membranes are moist.     Pharynx: Oropharynx is clear. No oropharyngeal exudate or posterior oropharyngeal erythema.  Eyes:     General: No scleral icterus.       Right eye: No discharge.        Left eye: No discharge.     Extraocular Movements: Extraocular movements  intact.     Conjunctiva/sclera: Conjunctivae normal.     Pupils: Pupils are equal, round, and reactive to light.  Cardiovascular:     Rate and Rhythm: Normal rate and regular rhythm.  Pulmonary:     Effort: Pulmonary effort is normal. No respiratory distress.     Breath sounds: Normal breath sounds.  Abdominal:     General: Bowel sounds are normal.  Musculoskeletal:     Cervical back: No rigidity or tenderness.  Skin:    General: Skin is warm and dry.  Neurological:     Mental Status: He is alert and oriented to person, place, and time.  Psychiatric:        Mood and Affect: Mood normal.        Behavior: Behavior normal.      No results found for any visits on 07/23/21.    The 10-year ASCVD risk score (Arnett DK, et al., 2019) is: 30.8%    Assessment & Plan:   Problem List Items Addressed This Visit       Cardiovascular and Mediastinum   Essential hypertension   Relevant Medications   lisinopril-hydrochlorothiazide (ZESTORETIC) 20-25 MG tablet   atorvastatin (LIPITOR) 10 MG tablet   Other Relevant Orders   Comprehensive metabolic panel   Calcified atheromatous plaque  Relevant Medications   lisinopril-hydrochlorothiazide (ZESTORETIC) 20-25 MG tablet   atorvastatin (LIPITOR) 10 MG tablet     Other   Mixed hyperlipidemia   Relevant Medications   lisinopril-hydrochlorothiazide (ZESTORETIC) 20-25 MG tablet   atorvastatin (LIPITOR) 10 MG tablet   Other Relevant Orders   Lipid panel   Comprehensive metabolic panel    Return in about 6 months (around 01/22/2022).  Continue current medications.  Discussed statin myalgias.  Given permission to take a 2-week statin break if he thinks any muscle aches that he may be having is related.  He will let me.  Continue Zestoretic at increased dose for blood pressure.  Libby Maw, MD

## 2021-07-27 ENCOUNTER — Encounter: Payer: Self-pay | Admitting: Pulmonary Disease

## 2021-09-10 ENCOUNTER — Telehealth: Payer: Self-pay | Admitting: Pulmonary Disease

## 2021-09-10 NOTE — Telephone Encounter (Signed)
I see the order was placed in 07/2021.

## 2021-09-10 NOTE — Telephone Encounter (Signed)
I called [employer] and spoke with Magda Paganini and they will fax another copy of the form to the office. Nothing further needed.

## 2021-09-10 NOTE — Telephone Encounter (Signed)
Spoke with Randall Kirby who states they are looking for a CMN for C-pap supplies on this pt. [name] or [name] have either of you seen one for this pt?

## 2021-09-17 DIAGNOSIS — H26492 Other secondary cataract, left eye: Secondary | ICD-10-CM | POA: Diagnosis not present

## 2021-09-17 DIAGNOSIS — H524 Presbyopia: Secondary | ICD-10-CM | POA: Diagnosis not present

## 2021-09-17 DIAGNOSIS — Z961 Presence of intraocular lens: Secondary | ICD-10-CM | POA: Diagnosis not present

## 2021-10-01 DIAGNOSIS — H26492 Other secondary cataract, left eye: Secondary | ICD-10-CM | POA: Diagnosis not present

## 2022-01-28 DIAGNOSIS — Z23 Encounter for immunization: Secondary | ICD-10-CM | POA: Diagnosis not present

## 2022-03-09 ENCOUNTER — Encounter: Payer: Self-pay | Admitting: Family Medicine

## 2022-03-09 ENCOUNTER — Ambulatory Visit (INDEPENDENT_AMBULATORY_CARE_PROVIDER_SITE_OTHER): Payer: Medicare Other | Admitting: Family Medicine

## 2022-03-09 VITALS — BP 132/68 | HR 58 | Temp 97.1°F | Ht 71.0 in | Wt 186.0 lb

## 2022-03-09 DIAGNOSIS — I7090 Unspecified atherosclerosis: Secondary | ICD-10-CM | POA: Diagnosis not present

## 2022-03-09 DIAGNOSIS — I10 Essential (primary) hypertension: Secondary | ICD-10-CM | POA: Diagnosis not present

## 2022-03-09 DIAGNOSIS — Z125 Encounter for screening for malignant neoplasm of prostate: Secondary | ICD-10-CM

## 2022-03-09 DIAGNOSIS — Z0000 Encounter for general adult medical examination without abnormal findings: Secondary | ICD-10-CM

## 2022-03-09 DIAGNOSIS — E782 Mixed hyperlipidemia: Secondary | ICD-10-CM | POA: Diagnosis not present

## 2022-03-09 LAB — URINALYSIS, ROUTINE W REFLEX MICROSCOPIC
Bilirubin Urine: NEGATIVE
Ketones, ur: NEGATIVE
Leukocytes,Ua: NEGATIVE
Nitrite: NEGATIVE
Specific Gravity, Urine: 1.01 (ref 1.000–1.030)
Total Protein, Urine: NEGATIVE
Urine Glucose: NEGATIVE
Urobilinogen, UA: 0.2 (ref 0.0–1.0)
pH: 7 (ref 5.0–8.0)

## 2022-03-09 LAB — PSA: PSA: 1.4 ng/mL (ref 0.10–4.00)

## 2022-03-09 LAB — CBC
HCT: 41.8 % (ref 39.0–52.0)
Hemoglobin: 14.5 g/dL (ref 13.0–17.0)
MCHC: 34.7 g/dL (ref 30.0–36.0)
MCV: 86.4 fl (ref 78.0–100.0)
Platelets: 206 10*3/uL (ref 150.0–400.0)
RBC: 4.83 Mil/uL (ref 4.22–5.81)
RDW: 12.8 % (ref 11.5–15.5)
WBC: 4.9 10*3/uL (ref 4.0–10.5)

## 2022-03-09 LAB — COMPREHENSIVE METABOLIC PANEL
ALT: 16 U/L (ref 0–53)
AST: 20 U/L (ref 0–37)
Albumin: 4.5 g/dL (ref 3.5–5.2)
Alkaline Phosphatase: 41 U/L (ref 39–117)
BUN: 17 mg/dL (ref 6–23)
CO2: 28 mEq/L (ref 19–32)
Calcium: 9.2 mg/dL (ref 8.4–10.5)
Chloride: 102 mEq/L (ref 96–112)
Creatinine, Ser: 0.87 mg/dL (ref 0.40–1.50)
GFR: 84.61 mL/min (ref >60.00)
Glucose, Bld: 103 mg/dL — ABNORMAL HIGH (ref 70–99)
Potassium: 3.8 mEq/L (ref 3.5–5.1)
Sodium: 139 mEq/L (ref 135–145)
Total Bilirubin: 0.8 mg/dL (ref 0.2–1.2)
Total Protein: 7.2 g/dL (ref 6.0–8.3)

## 2022-03-09 LAB — LIPID PANEL
Cholesterol: 108 mg/dL (ref 0–200)
HDL: 37.4 mg/dL — ABNORMAL LOW (ref >39.00)
LDL Cholesterol: 48 mg/dL (ref 0–99)
NonHDL: 70.14
Total CHOL/HDL Ratio: 3
Triglycerides: 109 mg/dL (ref 0.0–149.0)
VLDL: 21.8 mg/dL (ref 0.0–40.0)

## 2022-03-09 MED ORDER — ATORVASTATIN CALCIUM 10 MG PO TABS: 10.0000 mg | ORAL_TABLET | ORAL | 3 refills | Status: DC

## 2022-03-09 MED ORDER — LISINOPRIL-HYDROCHLOROTHIAZIDE 20-25 MG PO TABS: 1.0000 | ORAL_TABLET | Freq: Every day | ORAL | 3 refills | Status: DC

## 2022-03-09 NOTE — Progress Notes (Signed)
Established Patient Office Visit   Subjective:  Patient ID: Randall Kirby, male    DOB: Oct 15, 1945  Age: 77 y.o. MRN: [id number]  Chief Complaint  Patient presents with   Annual Exam    CPE, states that cholesterol was causing some aches and pains. Patient fasting.     HPI Encounter Diagnoses  Name Primary?   Healthcare maintenance Yes   Essential hypertension    Mixed hyperlipidemia    Calcified atheromatous plaque    For physical follow-up of elevated cholesterol with a history of vascular disease and hypertension.  Blood pressure well-controlled with Zestoretic.  His home cuff seems to be reading higher.  Last LDL cholesterol was 38.  He has regular dental care.  He is active by working on his old cars.  His hands bother him from time to time with stiffness especially after he has been doing restoration projects.   Review of Systems  Constitutional: Negative.   HENT: Negative.    Eyes:  Negative for blurred vision, discharge and redness.  Respiratory: Negative.    Cardiovascular: Negative.   Gastrointestinal:  Negative for abdominal pain.  Genitourinary: Negative.  Negative for dysuria, frequency and urgency.  Musculoskeletal:  Positive for joint pain. Negative for myalgias.  Skin:  Negative for rash.  Neurological:  Negative for tingling, loss of consciousness and weakness.  Endo/Heme/Allergies:  Negative for polydipsia.     Current Outpatient Medications:    aspirin 81 MG tablet, Take 81 mg by mouth daily., Disp: , Rfl:    atorvastatin (LIPITOR) 10 MG tablet, Take 1 tablet (10 mg total) by mouth every other day., Disp: 90 tablet, Rfl: 3   lisinopril-hydrochlorothiazide (ZESTORETIC) 20-25 MG tablet, Take 1 tablet by mouth daily., Disp: 90 tablet, Rfl: 3   Objective:     BP 132/68 (BP Location: Right Arm, Patient Position: Sitting, Cuff Size: Normal)   Pulse (!) 58   Temp (!) 97.1 F (36.2 C) (Temporal)   Ht '5\' 11"'$  (1.803 m)   Wt 186 lb (84.4 kg)   SpO2 98%    BMI 25.94 kg/m    Physical Exam Constitutional:      General: He is not in acute distress.    Appearance: Normal appearance. He is not ill-appearing, toxic-appearing or diaphoretic.  HENT:     Head: Normocephalic and atraumatic.     Right Ear: External ear normal.     Left Ear: External ear normal.     Mouth/Throat:     Mouth: Mucous membranes are moist.     Pharynx: Oropharynx is clear. No oropharyngeal exudate or posterior oropharyngeal erythema.  Eyes:     General: No scleral icterus.       Right eye: No discharge.        Left eye: No discharge.     Extraocular Movements: Extraocular movements intact.     Conjunctiva/sclera: Conjunctivae normal.     Pupils: Pupils are equal, round, and reactive to light.  Cardiovascular:     Rate and Rhythm: Normal rate and regular rhythm.  Pulmonary:     Effort: Pulmonary effort is normal. No respiratory distress.     Breath sounds: Normal breath sounds.  Abdominal:     General: Bowel sounds are normal. There is no distension.     Tenderness: There is no abdominal tenderness. There is no guarding.  Genitourinary:    Prostate: Enlarged. Not tender and no nodules present.     Rectum: Guaiac result negative. No mass, tenderness, anal fissure,  external hemorrhoid or internal hemorrhoid. Normal anal tone.  Musculoskeletal:     Cervical back: No rigidity or tenderness.  Skin:    General: Skin is warm and dry.  Neurological:     Mental Status: He is alert and oriented to person, place, and time.  Psychiatric:        Mood and Affect: Mood normal.        Behavior: Behavior normal.      No results found for any visits on 03/09/22.    The ASCVD Risk score (Arnett DK, et al., 2019) failed to calculate for the following reasons:   The valid total cholesterol range is 130 to 320 mg/dL    Assessment & Plan:   Healthcare maintenance -     PSA  Essential hypertension -     Lisinopril-hydroCHLOROthiazide; Take 1 tablet by mouth daily.   Dispense: 90 tablet; Refill: 3 -     CBC -     Comprehensive metabolic panel -     Urinalysis, Routine w reflex microscopic  Mixed hyperlipidemia -     Atorvastatin Calcium; Take 1 tablet (10 mg total) by mouth every other day.  Dispense: 90 tablet; Refill: 3 -     Lipid panel  Calcified atheromatous plaque -     Atorvastatin Calcium; Take 1 tablet (10 mg total) by mouth every other day.  Dispense: 90 tablet; Refill: 3    Return in about 6 months (around 09/07/2022).  Continue current dose of Zestoretic.  Will start taking atorvastatin 10 mg every other day.  Suggested Voltaren gel for aches and pains and pends.  Could consider referral to a hand surgeon. Information was given on health maintenance and disease prevention.  Continue active healthy lifestyle.  Libby Maw, MD

## 2022-03-26 ENCOUNTER — Telehealth: Payer: Self-pay | Admitting: Cardiology

## 2022-03-26 DIAGNOSIS — I7121 Aneurysm of the ascending aorta, without rupture: Secondary | ICD-10-CM

## 2022-03-26 NOTE — Telephone Encounter (Signed)
Spoke with pt wife, order placed for CTA to be done at [place]. Lab orders mailed to the pt.

## 2022-03-26 NOTE — Telephone Encounter (Signed)
Pt wife calling to sch pt a CT because he has one every year.

## 2022-04-15 DIAGNOSIS — I7121 Aneurysm of the ascending aorta, without rupture: Secondary | ICD-10-CM | POA: Diagnosis not present

## 2022-04-16 LAB — BASIC METABOLIC PANEL
BUN/Creatinine Ratio: 18 (ref 10–24)
BUN: 16 mg/dL (ref 8–27)
CO2: 26 mmol/L (ref 20–29)
Calcium: 9.1 mg/dL (ref 8.6–10.2)
Chloride: 101 mmol/L (ref 96–106)
Creatinine, Ser: 0.9 mg/dL (ref 0.76–1.27)
Glucose: 106 mg/dL — ABNORMAL HIGH (ref 70–99)
Potassium: 3.7 mmol/L (ref 3.5–5.2)
Sodium: 141 mmol/L (ref 134–144)
eGFR: 89 mL/min/{1.73_m2} (ref >59)

## 2022-04-20 ENCOUNTER — Telehealth: Payer: Self-pay | Admitting: Family Medicine

## 2022-04-20 NOTE — Telephone Encounter (Signed)
Pt called stating Medicare and [employer] denied claim. Pt asking reason for denial and stating he thinks it's because he is taking atorvastatin. Pt was advised we will review and call back. Msg sent to [facility] Billing Mgr.

## 2022-04-21 ENCOUNTER — Ambulatory Visit (HOSPITAL_COMMUNITY)
Admission: RE | Admit: 2022-04-21 | Discharge: 2022-04-21 | Disposition: A | Discharge status: Home / Self Care | Payer: Medicare Other | Source: Ambulatory Visit | Attending: Cardiology | Admitting: Cardiology

## 2022-04-21 DIAGNOSIS — I7121 Aneurysm of the ascending aorta, without rupture: Secondary | ICD-10-CM | POA: Diagnosis not present

## 2022-04-21 MED ORDER — SODIUM CHLORIDE (PF) 0.9 % IJ SOLN
INTRAMUSCULAR | Status: AC
  Filled 2022-04-21: qty 50

## 2022-04-21 MED ORDER — IOHEXOL 350 MG/ML SOLN
100.0000 mL | Freq: Once | INTRAVENOUS | Status: AC | PRN
  Administered 2022-04-21: 100 mL via INTRAVENOUS

## 2022-04-22 ENCOUNTER — Telehealth: Payer: Self-pay | Admitting: Cardiology

## 2022-04-22 NOTE — Telephone Encounter (Signed)
Malachy Mood with [facility] is calling to report critical CT Angtio Chest results.

## 2022-04-22 NOTE — Telephone Encounter (Signed)
Spoke to  personnel from  [facility] radiology   Radiology dept. wanted  Cardiology to be aware of the Impression  #2 of patient;s CT angio chest Aorta completed on 04/21/22.  2. New 15 mm (average dimension) wedge-shaped area of consolidation in the peripheral right lower lobe. Consider one of the following in 3 months for both low-risk and high-risk individuals: (a) repeat chest CT, (b) follow-up PET-CT, or (c) tissue sampling. This recommendation follows the consensus statement: Guidelines for Management of Incidental Pulmonary Nodules Detected on CT Images: From the Fleischner Society 2017; Radiology 2017; 284:228-243.     Information routed to Dr Martinique

## 2022-04-23 ENCOUNTER — Other Ambulatory Visit: Payer: Self-pay

## 2022-04-26 ENCOUNTER — Other Ambulatory Visit: Payer: Self-pay

## 2022-04-26 DIAGNOSIS — I7121 Aneurysm of the ascending aorta, without rupture: Secondary | ICD-10-CM

## 2022-04-26 NOTE — Telephone Encounter (Signed)
Spoke to patient results given.Advised lung pet scan to be scheduled in 3 months.Order placed.Advised someone from that dept will call with appointment.

## 2022-04-30 NOTE — Telephone Encounter (Signed)
Notified pt that bill has been resubmitted and to allow 30-45 days for process completion.

## 2022-05-10 ENCOUNTER — Other Ambulatory Visit: Payer: Self-pay

## 2022-05-10 DIAGNOSIS — R9389 Abnormal findings on diagnostic imaging of other specified body structures: Secondary | ICD-10-CM

## 2022-05-10 DIAGNOSIS — R911 Solitary pulmonary nodule: Secondary | ICD-10-CM

## 2022-05-10 DIAGNOSIS — I7121 Aneurysm of the ascending aorta, without rupture: Secondary | ICD-10-CM

## 2022-05-11 ENCOUNTER — Other Ambulatory Visit: Payer: Self-pay

## 2022-05-11 DIAGNOSIS — R911 Solitary pulmonary nodule: Secondary | ICD-10-CM

## 2022-05-11 DIAGNOSIS — R918 Other nonspecific abnormal finding of lung field: Secondary | ICD-10-CM

## 2022-05-11 DIAGNOSIS — R0602 Shortness of breath: Secondary | ICD-10-CM

## 2022-05-11 NOTE — Telephone Encounter (Signed)
Lung pet scan scheduled 6/28 at 11:20 am.

## 2022-05-21 DIAGNOSIS — D225 Melanocytic nevi of trunk: Secondary | ICD-10-CM | POA: Diagnosis not present

## 2022-05-21 DIAGNOSIS — L821 Other seborrheic keratosis: Secondary | ICD-10-CM | POA: Diagnosis not present

## 2022-05-21 DIAGNOSIS — Z85828 Personal history of other malignant neoplasm of skin: Secondary | ICD-10-CM | POA: Diagnosis not present

## 2022-05-21 DIAGNOSIS — L570 Actinic keratosis: Secondary | ICD-10-CM | POA: Diagnosis not present

## 2022-05-21 DIAGNOSIS — L814 Other melanin hyperpigmentation: Secondary | ICD-10-CM | POA: Diagnosis not present

## 2022-05-21 DIAGNOSIS — D2261 Melanocytic nevi of right upper limb, including shoulder: Secondary | ICD-10-CM | POA: Diagnosis not present

## 2022-05-21 DIAGNOSIS — D2272 Melanocytic nevi of left lower limb, including hip: Secondary | ICD-10-CM | POA: Diagnosis not present

## 2022-05-21 DIAGNOSIS — D1801 Hemangioma of skin and subcutaneous tissue: Secondary | ICD-10-CM | POA: Diagnosis not present

## 2022-06-02 ENCOUNTER — Telehealth: Payer: Self-pay | Admitting: Cardiology

## 2022-06-02 NOTE — Telephone Encounter (Signed)
Called patient- he does not know for sure what Randall Kirby was calling him for? I do not see any notes. Told the pt that I would just send a message to [place], and she will follow up with him when she is back in the office.

## 2022-06-02 NOTE — Telephone Encounter (Signed)
Pt states that he is returning a call from yesterday to nurse Elnita Maxwell. Please advise

## 2022-06-03 NOTE — Telephone Encounter (Signed)
Spoke to patient follow up appointment scheduled with Dr.Jordan 7/2 at 3:00 pm.

## 2022-07-15 ENCOUNTER — Other Ambulatory Visit: Payer: Self-pay | Admitting: Family Medicine

## 2022-07-15 DIAGNOSIS — I10 Essential (primary) hypertension: Secondary | ICD-10-CM

## 2022-08-06 ENCOUNTER — Encounter (HOSPITAL_COMMUNITY)
Admission: RE | Admit: 2022-08-06 | Discharge: 2022-08-06 | Disposition: A | Discharge status: Home / Self Care | Payer: Medicare Other | Source: Ambulatory Visit | Attending: Cardiology | Admitting: Cardiology

## 2022-08-06 DIAGNOSIS — R911 Solitary pulmonary nodule: Secondary | ICD-10-CM | POA: Diagnosis not present

## 2022-08-06 DIAGNOSIS — R918 Other nonspecific abnormal finding of lung field: Secondary | ICD-10-CM

## 2022-08-06 DIAGNOSIS — R0602 Shortness of breath: Secondary | ICD-10-CM

## 2022-08-06 LAB — GLUCOSE, CAPILLARY: Glucose-Capillary: 105 mg/dL — ABNORMAL HIGH (ref 70–99)

## 2022-08-06 MED ORDER — FLUDEOXYGLUCOSE F - 18 (FDG) INJECTION
9.1800 | Freq: Once | INTRAVENOUS | Status: AC
  Administered 2022-08-06: 9.18 via INTRAVENOUS

## 2022-08-08 NOTE — Progress Notes (Unsigned)
Cardiology Office Note   Date:  08/10/2022   ID:  Irwin, Rubio 08/18/1945, MRN [id number]  PCP:  Mliss Sax, MD  Cardiologist:   Denece Shearer [place], MD   Chief Complaint  Patient presents with   Thoracic Aortic Aneurysm      History of Present Illness: SOVEREIGN YAMANE is a 77 y.o. male who is seen for follow up CAD and thoracic aneurysm. Last seen by me in 1019. He was seen in 2019 for dyspnea. Echo was unremarkable except for thoracic aneurysm. Myoview was normal. Subsequent CT scans yearly have shown no change in aneurysm 4.3 cm. She has a history of HTN and OSA. Last CT in March showed a wedge shaped density in the RLL. He had mild aortic and coronary calcification. PET CT ordered. Report is still pending. He does report an URI about 6 months ago. Quit smoking in 1969.     Past Medical History:  Diagnosis Date   Cataract    per pt   Hypertension    Sleep apnea     Past Surgical History:  Procedure Laterality Date   CATARACT EXTRACTION, BILATERAL Bilateral    CHOLECYSTECTOMY     COLONOSCOPY  05/31/2013   lumbar HNP     SKIN CANCER EXCISION     nose     Current Outpatient Medications  Medication Sig Dispense Refill   aspirin 81 MG tablet Take 81 mg by mouth daily.     atorvastatin (LIPITOR) 10 MG tablet Take 1 tablet (10 mg total) by mouth every other day. 90 tablet 3   lisinopril-hydrochlorothiazide (ZESTORETIC) 20-25 MG tablet TAKE 1 TABLET BY MOUTH DAILY. 90 tablet 3   No current facility-administered medications for this visit.    Allergies:   Penicillins    Social History:  The patient  reports that he quit smoking about 56 years ago. His smoking use included cigarettes. He has a 3.00 pack-year smoking history. He has never used smokeless tobacco. He reports current alcohol use. He reports that he does not use drugs.   Family History:  The patient's family history includes Asthma in an other family member; Heart Problems in his mother;  Heart disease in an other family member.    ROS:  Please see the history of present illness.   Otherwise, review of systems are positive for .   All other systems are reviewed and negative.    PHYSICAL EXAM: VS:  BP 130/66 (BP Location: Left Arm, Patient Position: Sitting, Cuff Size: Normal)   Pulse 65   Ht 5\' 11"  (1.803 m)   Wt 183 lb 3.2 oz (83.1 kg)   SpO2 98%   BMI 25.55 kg/m  , BMI Body mass index is 25.55 kg/m. GEN: Well nourished, well developed, in no acute distress  HEENT: normal  Neck: no JVD, carotid bruits, or masses Cardiac: RRR; no murmurs, rubs, or gallops,no edema  Respiratory:  clear to auscultation bilaterally, normal work of breathing GI: soft, nontender, nondistended, + BS MS: no deformity or atrophy  Skin: warm and dry, no rash Neuro:  Strength and sensation are intact Psych: euthymic mood, full affect   EKG Interpretation Date/Time:  Tuesday August 10 2022 14:53:00 EDT Ventricular Rate:  65 PR Interval:  178 QRS Duration:  96 QT Interval:  418 QTC Calculation: 434 R Axis:   -63  Text Interpretation: Normal sinus rhythm Left axis deviation  compared to prior tracing in Nov 2019 PACs have resolved. Confirmed by [place],  Theron Arista [phone]) on 08/10/2022 2:56:49 PM   Recent Labs: 03/09/2022: ALT 16; Hemoglobin 14.5; Platelets 206.0 04/15/2022: BUN 16; Creatinine, Ser 0.90; Potassium 3.7; Sodium 141    Lipid Panel    Component Value Date/Time   CHOL 108 03/09/2022 1121   TRIG 109.0 03/09/2022 1121   HDL 37.40 (L) 03/09/2022 1121   CHOLHDL 3 03/09/2022 1121   VLDL 21.8 03/09/2022 1121   LDLCALC 48 03/09/2022 1121   LDLDIRECT 94.0 01/06/2021 1124      Wt Readings from Last 3 Encounters:  08/10/22 183 lb 3.2 oz (83.1 kg)  03/09/22 186 lb (84.4 kg)  07/23/21 183 lb 6.4 oz (83.2 kg)      Other studies Reviewed: Echo 11/15/17: Study Conclusions   - Left ventricle: The cavity size was mildly dilated. Systolic    function was normal. The estimated  ejection fraction was in the    range of 50% to 55%. Wall motion was normal; there were no    regional wall motion abnormalities. There was an increased    relative contribution of atrial contraction to ventricular    filling. Doppler parameters are consistent with abnormal left    ventricular relaxation (grade 1 diastolic dysfunction).  - Aortic valve: Trileaflet; normal thickness, mildly calcified    leaflets. There was mild regurgitation. Regurgitation pressure    half-time: 545 ms.  - Aorta: Aortic root dimension: 42 mm (ED). Ascending aortic    diameter: 45 mm (S).  - Aortic root: The aortic root was mildly dilated.  - Ascending aorta: The ascending aorta was moderately dilated.  - Mitral valve: Calcified annulus.  - Tricuspid valve: There was trivial regurgitation.  - Pulmonic valve: There was trivial regurgitation.   Myoview 11/12:19: Study Highlights    Nuclear stress EF: 53%. The left ventricular ejection fraction is mildly decreased (45-54%). There was no ST segment deviation noted during stress.   Diaphragmatic attenuation no ischemia Estimated EF 53%   ASSESSMENT AND PLAN:  1.  Thoracic aortic aneurysm- stable 4.3 cm 2. PACs asymptomatic 3. HTN controlled.  4. OSA. Not currently using CPAP. 5. ? Pulmonary infiltrate/mass on CT. PET scan pending. If report is benign will continue yearly CT. If abnormal will need pulmonary evaluation.     Disposition:   FU with me 1 year  Signed, Madlyn Crosby [place], MD  08/10/2022 3:01 PM    [facility] [address], [place], [place], [address] Phone [phone], Fax [phone]

## 2022-08-10 ENCOUNTER — Ambulatory Visit: Payer: Medicare Other | Attending: Cardiology | Admitting: Cardiology

## 2022-08-10 ENCOUNTER — Encounter: Payer: Self-pay | Admitting: Cardiology

## 2022-08-10 VITALS — BP 130/66 | HR 65 | Ht 71.0 in | Wt 183.2 lb

## 2022-08-10 DIAGNOSIS — R0602 Shortness of breath: Secondary | ICD-10-CM | POA: Insufficient documentation

## 2022-08-10 DIAGNOSIS — R918 Other nonspecific abnormal finding of lung field: Secondary | ICD-10-CM | POA: Insufficient documentation

## 2022-08-10 DIAGNOSIS — I7121 Aneurysm of the ascending aorta, without rupture: Secondary | ICD-10-CM | POA: Insufficient documentation

## 2022-08-10 NOTE — Patient Instructions (Signed)
Medication Instructions:  Continue same medications *If you need a refill on your cardiac medications before your next appointment, please call your pharmacy*   Lab Work: None ordered   Testing/Procedures: None ordered   Follow-Up: At [facility], you and your health needs are our priority.  As part of our continuing mission to provide you with exceptional heart care, we have created designated Provider Care Teams.  These Care Teams include your primary Cardiologist (physician) and Advanced Practice Providers (APPs -  Physician Assistants and Nurse Practitioners) who all work together to provide you with the care you need, when you need it.  We recommend signing up for the patient portal called "MyChart".  Sign up information is provided on this After Visit Summary.  MyChart is used to connect with patients for Virtual Visits (Telemedicine).  Patients are able to view lab/test results, encounter notes, upcoming appointments, etc.  Non-urgent messages can be sent to your provider as well.   To learn more about what you can do with MyChart, go to [email or web address].    Your next appointment:  1 year   Call in March to schedule July appointment     Provider:  Dr.Jordan

## 2022-10-18 DIAGNOSIS — Z961 Presence of intraocular lens: Secondary | ICD-10-CM | POA: Diagnosis not present

## 2022-10-25 ENCOUNTER — Telehealth: Payer: Self-pay | Admitting: Pulmonary Disease

## 2022-10-25 NOTE — Telephone Encounter (Signed)
Patient is calling because he needs a prescription so that he can get a new CPAP machine. Last seen 07/2021

## 2022-10-25 NOTE — Telephone Encounter (Signed)
ATC x1.  LVM to return call.

## 2022-10-25 NOTE — Telephone Encounter (Signed)
Needs OV to get new script for new CPAP machine.  He has an appointment with Dr. Wynona Neat on 9/17.

## 2022-10-26 ENCOUNTER — Encounter: Payer: Self-pay | Admitting: Pulmonary Disease

## 2022-10-26 ENCOUNTER — Ambulatory Visit (INDEPENDENT_AMBULATORY_CARE_PROVIDER_SITE_OTHER): Payer: Medicare Other | Admitting: Pulmonary Disease

## 2022-10-26 VITALS — BP 126/62 | HR 67 | Ht 72.0 in | Wt 184.0 lb

## 2022-10-26 DIAGNOSIS — G4733 Obstructive sleep apnea (adult) (pediatric): Secondary | ICD-10-CM

## 2022-10-26 NOTE — Progress Notes (Signed)
Current Outpatient Medications on File Prior to Visit  Medication Sig   aspirin 81 MG tablet Take 81 mg by mouth daily.   atorvastatin (LIPITOR) 10 MG tablet Take 1 tablet (10 mg total) by mouth every other day.   lisinopril-hydrochlorothiazide (ZESTORETIC) 20-25 MG tablet TAKE 1 TABLET BY MOUTH DAILY.   No current facility-administered medications on file prior to visit.     Chief Complaint  Patient presents with   Follow-up    Need new CPAP.   Continues to use CPAP on a nightly basis Has not been having any significant problems with his CPAP  Sleeps well wakes up feeling like he got a good nights rest  No significant concerns  Machine continues to do well although in the last few days has received messages of machine past his motor life  Has also had some issues with the humidification  Denies any significant chest pains or chest discomfort History of premature atrial contraction-has been stable  Stays very active Continues to function well  Sleep tests PSG 04/09/03 >> AHI 32 CPAP 06/19/16 to 09/16/16 >> used on 90 of 90 nights with average 8 hrs 24 min.  Average AHI 4.2 with CPAP 5 cm H2O  Past medical history HTN  Past surgical history, Family history, Social history, Allergies all reviewed.  Vital Signs BP 126/62 (BP Location: Left Arm, Cuff Size: Normal)   Pulse 67   Ht 6' (1.829 m)   Wt 184 lb (83.5 kg)   SpO2 97%   BMI 24.95 kg/m   History of Present Illness Randall Kirby is a 77 y.o. male with OSA.  He uses CPAP nightly.  He has nasal pillows mask.  He gets air leaking from his tear ducts.  This causes dry eyes.    Physical Exam  General -in no acute distress Cardiac -S1-S2 appreciated Chest -clear breath sounds Neuro - Normal strength Skin - No rashes Psych - normal mood, and behavior   Assessment/Plan  Compliance -100% compliance Average use of 9 hours CPAP of 5 Residual AHI of 5.5  Continue using CPAP nightly  Machine is  dated Will need a new machine  Order for new machine will be placed to go to DME  Will see you a year from now  Call us with significant concerns   Virl Diamond, MD [facility] P[facility]M Pager: See Loretha Stapler

## 2022-10-26 NOTE — Progress Notes (Deleted)
Randall Kirby    [id number]    09-16-1945  Primary Care Physician:Kremer, Talmadge Coventry, MD  Referring Physician: Mliss Sax, MD [address] [place],  [place] [address]  Chief complaint:  ***  HPI:  ***  Pets: Occupation: Exposures: Smoking history: Travel history: Relevant family history:  Outpatient Encounter Medications as of 10/26/2022  Medication Sig   aspirin 81 MG tablet Take 81 mg by mouth daily.   atorvastatin (LIPITOR) 10 MG tablet Take 1 tablet (10 mg total) by mouth every other day.   lisinopril-hydrochlorothiazide (ZESTORETIC) 20-25 MG tablet TAKE 1 TABLET BY MOUTH DAILY.   No facility-administered encounter medications on file as of 10/26/2022.    Allergies as of 10/26/2022 - Review Complete 10/26/2022  Allergen Reaction Noted   Penicillins Other (See Comments) 10/25/2006    Past Medical History:  Diagnosis Date   Cataract    per pt   Hypertension    Sleep apnea     Past Surgical History:  Procedure Laterality Date   CATARACT EXTRACTION, BILATERAL Bilateral    CHOLECYSTECTOMY     COLONOSCOPY  05/31/2013   lumbar HNP     SKIN CANCER EXCISION     nose    Family History  Problem Relation Age of Onset   Asthma Other    Heart disease Other    Heart Problems Mother    Colon cancer Neg Hx    Colon polyps Neg Hx    Esophageal cancer Neg Hx    Rectal cancer Neg Hx    Stomach cancer Neg Hx     Social History   Socioeconomic History   Marital status: Married    Spouse name: Not on file   Number of children: Not on file   Years of education: Not on file   Highest education level: Not on file  Occupational History   Not on file  Tobacco Use   Smoking status: Former    Current packs/day: 0.00    Average packs/day: 1 pack/day for 3.0 years (3.0 ttl pk-yrs)    Types: Cigarettes    Start date: 02/09/1963    Quit date: 02/08/1966    Years since quitting: 56.7   Smokeless tobacco: Never  Vaping Use    Vaping status: Never Used  Substance and Sexual Activity   Alcohol use: Yes    Comment: ocassional beer/rare 1-2 beers per year per patient/Rm   Drug use: No   Sexual activity: Not on file  Other Topics Concern   Not on file  Social History Narrative   Not on file   Social Determinants of Health   Financial Resource Strain: Low Risk  (09/23/2020)   Overall Financial Resource Strain (CARDIA)    Difficulty of Paying Living Expenses: Not hard at all  Food Insecurity: No Food Insecurity (09/23/2020)   Hunger Vital Sign    Worried About Running Out of Food in the Last Year: Never true    Ran Out of Food in the Last Year: Never true  Transportation Needs: No Transportation Needs (09/19/2019)   PRAPARE - Administrator, Civil Service (Medical): No    Lack of Transportation (Non-Medical): No  Physical Activity: Inactive (09/19/2019)   Exercise Vital Sign    Days of Exercise per Week: 0 days    Minutes of Exercise per Session: 0 min  Stress: No Stress Concern Present (09/23/2020)   [employer] of Occupational Health - Occupational Stress Questionnaire  Feeling of Stress : Not at all  Social Connections: Moderately Integrated (09/23/2020)   Social Connection and Isolation Panel [NHANES]    Frequency of Communication with Friends and Family: Three times a week    Frequency of Social Gatherings with Friends and Family: Three times a week    Attends Religious Services: 1 to 4 times per year    Active Member of Clubs or Organizations: No    Attends Banker Meetings: Never    Marital Status: Married  Catering manager Violence: Not At Risk (09/19/2019)   Humiliation, Afraid, Rape, and Kick questionnaire    Fear of Current or Ex-Partner: No    Emotionally Abused: No    Physically Abused: No    Sexually Abused: No    Review of Systems  Vitals:   10/26/22 1101  BP: 126/62  Pulse: 67  SpO2: 97%     Physical Exam   Data Reviewed: ***  Assessment:   ***  Plan/Recommendations: ***   Virl Diamond MD [facility] 10/26/2022, 11:26 AM  CC: Mliss Sax

## 2022-10-26 NOTE — Patient Instructions (Addendum)
Placed order for new CPAP  CPAP of 5  Continue other lines of care  Call us with significant concerns  We will keep your yearly follow-ups

## 2022-12-02 ENCOUNTER — Ambulatory Visit: Payer: Medicare Other | Admitting: Pulmonary Disease

## 2023-03-18 ENCOUNTER — Telehealth: Payer: Self-pay

## 2023-03-18 ENCOUNTER — Encounter: Payer: Self-pay | Admitting: Family Medicine

## 2023-03-18 ENCOUNTER — Ambulatory Visit: Payer: Medicare Other | Admitting: Family Medicine

## 2023-03-18 ENCOUNTER — Ambulatory Visit (INDEPENDENT_AMBULATORY_CARE_PROVIDER_SITE_OTHER): Payer: Medicare Other | Admitting: Family Medicine

## 2023-03-18 VITALS — BP 110/62 | HR 51 | Temp 97.7°F | Ht 72.0 in | Wt 184.8 lb

## 2023-03-18 DIAGNOSIS — R6889 Other general symptoms and signs: Secondary | ICD-10-CM | POA: Diagnosis not present

## 2023-03-18 DIAGNOSIS — R058 Other specified cough: Secondary | ICD-10-CM | POA: Diagnosis not present

## 2023-03-18 DIAGNOSIS — Z1152 Encounter for screening for COVID-19: Secondary | ICD-10-CM

## 2023-03-18 LAB — POC COVID19 BINAXNOW: SARS Coronavirus 2 Ag: NEGATIVE

## 2023-03-18 LAB — POCT INFLUENZA A/B
Influenza A, POC: NEGATIVE
Influenza B, POC: NEGATIVE

## 2023-03-18 NOTE — Progress Notes (Signed)
 Established Patient Office Visit   Subjective:  Patient ID: Randall Kirby, male    DOB: 10/09/1945  Age: 78 y.o. MRN: [id number]  Chief Complaint  Patient presents with   Cough    Nasla congestion. Fatigue, body aches.     Cough Pertinent negatives include no chills, eye redness, fever, myalgias, rash, shortness of breath or wheezing.   Encounter Diagnoses  Name Primary?   Flu-like symptoms Yes   Encounter for screening for COVID-19    Post-viral cough syndrome    Has had a lingering dry nonproductive cough after a recent flu illness.  Feels okay without fever or chills.  No nasal congestion or postnasal drip.  No sore throat.  No wheezing or difficulty breathing.   Review of Systems  Constitutional: Negative.  Negative for chills and fever.  HENT: Negative.    Eyes:  Negative for blurred vision, discharge and redness.  Respiratory:  Positive for cough. Negative for sputum production, shortness of breath and wheezing.   Cardiovascular: Negative.   Gastrointestinal:  Negative for abdominal pain.  Genitourinary: Negative.   Musculoskeletal: Negative.  Negative for myalgias.  Skin:  Negative for rash.  Neurological:  Negative for tingling, loss of consciousness and weakness.  Endo/Heme/Allergies:  Negative for polydipsia.     Current Outpatient Medications:    aspirin 81 MG tablet, Take 81 mg by mouth daily., Disp: , Rfl:    atorvastatin  (LIPITOR) 10 MG tablet, Take 1 tablet (10 mg total) by mouth every other day., Disp: 90 tablet, Rfl: 3   lisinopril -hydrochlorothiazide  (ZESTORETIC ) 20-25 MG tablet, TAKE 1 TABLET BY MOUTH DAILY., Disp: 90 tablet, Rfl: 3   Objective:     BP 110/62   Pulse (!) 51   Temp 97.7 F (36.5 C)   Ht 6' (1.829 m)   Wt 184 lb 12.8 oz (83.8 kg)   SpO2 97%   BMI 25.06 kg/m    Physical Exam Constitutional:      General: He is not in acute distress.    Appearance: Normal appearance. He is not ill-appearing, toxic-appearing or  diaphoretic.  HENT:     Head: Normocephalic and atraumatic.     Right Ear: Tympanic membrane, ear canal and external ear normal.     Left Ear: Tympanic membrane, ear canal and external ear normal.     Mouth/Throat:     Mouth: Mucous membranes are moist.     Pharynx: Oropharynx is clear. No oropharyngeal exudate or posterior oropharyngeal erythema.  Eyes:     General: No scleral icterus.       Right eye: No discharge.        Left eye: No discharge.     Extraocular Movements: Extraocular movements intact.     Conjunctiva/sclera: Conjunctivae normal.     Pupils: Pupils are equal, round, and reactive to light.  Cardiovascular:     Rate and Rhythm: Normal rate and regular rhythm.  Pulmonary:     Effort: Pulmonary effort is normal. No respiratory distress.     Breath sounds: Normal breath sounds. No wheezing, rhonchi or rales.  Abdominal:     General: Bowel sounds are normal.     Tenderness: There is no abdominal tenderness. There is no guarding.  Musculoskeletal:     Cervical back: No rigidity or tenderness.  Skin:    General: Skin is warm and dry.  Neurological:     Mental Status: He is alert and oriented to person, place, and time.  Psychiatric:  Mood and Affect: Mood normal.        Behavior: Behavior normal.      Results for orders placed or performed in visit on 03/18/23  POC COVID-19 BinaxNow  Result Value Ref Range   SARS Coronavirus 2 Ag Negative Negative  POCT Influenza A/B  Result Value Ref Range   Influenza A, POC Negative Negative   Influenza B, POC Negative Negative      The ASCVD Risk score (Arnett DK, et al., 2019) failed to calculate for the following reasons:   The valid total cholesterol range is 130 to 320 mg/dL    Assessment & Plan:   Flu-like symptoms -     POCT Influenza A/B  Encounter for screening for COVID-19 -     POC COVID-19 BinaxNow  Post-viral cough syndrome    Return if symptoms worsen or fail to improve, for Has physical  scheduled for July..  Mostly resolving.  Discussed using prednisone but he would like to hold off for now.  Will return if needed.  Elsie Sim Lent, MD

## 2023-03-18 NOTE — Patient Outreach (Signed)
  Care Coordination   In Person Provider Office Visit Note   03/18/2023 Name: AVRUM KIMBALL MRN: [id number] DOB: 02/08/1946  Randall Kirby is a 78 y.o. year old male who sees Berneta Elsie Sayre, MD for primary care. I engaged with Rojelio VEAR Plowman in the providers office today.  What matters to the patients health and wellness today?  Getting over flu    Goals Addressed             This Visit's Progress    COMPLETED: Care Coordination Activities-No follow up required       Care Coordination Interventions: Discussed services and support. Advised to discuss with primary care physician if services needed in the future.         SDOH assessments and interventions completed:  Yes  SDOH Interventions Today    Flowsheet Row Most Recent Value  SDOH Interventions   Food Insecurity Interventions Intervention Not Indicated  Housing Interventions Intervention Not Indicated  Transportation Interventions Intervention Not Indicated        Care Coordination Interventions:  Yes, provided   Follow up plan: No further intervention required.   Encounter Outcome:  Patient Visit Completed   Jenniah Bhavsar J. Emmalise Huard RN, MSN [facility], [facility] RN Care Manager Direct Dial: [phone]  Fax: [phone] Website: [email or web address]

## 2023-03-18 NOTE — Patient Instructions (Signed)
 Visit Information  Thank you for taking time to visit with me today. Please don't hesitate to contact me if I can be of assistance to you.   Following are the goals we discussed today:   Goals Addressed             This Visit's Progress    COMPLETED: Care Coordination Activities-No follow up required       Care Coordination Interventions: Discussed services and support. Advised to discuss with primary care physician if services needed in the future.          If you are experiencing a Mental Health or Behavioral Health Crisis or need someone to talk to, please call the Suicide and Crisis Lifeline: 988   Patient verbalizes understanding of instructions and care plan provided today and agrees to view in MyChart. Active MyChart status and patient understanding of how to access instructions and care plan via MyChart confirmed with patient.     The patient has been provided with contact information for the care management team and has been advised to call with any health related questions or concerns.   Bary Leriche RN, MSN [facility], [facility] RN Care Manager Direct Dial: [phone]  Fax: [phone] Website: [email or web address]

## 2023-04-23 ENCOUNTER — Other Ambulatory Visit: Payer: Self-pay | Admitting: Family Medicine

## 2023-04-23 DIAGNOSIS — E782 Mixed hyperlipidemia: Secondary | ICD-10-CM

## 2023-04-23 DIAGNOSIS — I7090 Unspecified atherosclerosis: Secondary | ICD-10-CM

## 2023-07-18 DIAGNOSIS — D225 Melanocytic nevi of trunk: Secondary | ICD-10-CM | POA: Diagnosis not present

## 2023-07-18 DIAGNOSIS — Z85828 Personal history of other malignant neoplasm of skin: Secondary | ICD-10-CM | POA: Diagnosis not present

## 2023-07-18 DIAGNOSIS — L814 Other melanin hyperpigmentation: Secondary | ICD-10-CM | POA: Diagnosis not present

## 2023-07-18 DIAGNOSIS — L570 Actinic keratosis: Secondary | ICD-10-CM | POA: Diagnosis not present

## 2023-07-18 DIAGNOSIS — L821 Other seborrheic keratosis: Secondary | ICD-10-CM | POA: Diagnosis not present

## 2023-08-15 ENCOUNTER — Other Ambulatory Visit: Payer: Self-pay | Admitting: Family Medicine

## 2023-08-15 DIAGNOSIS — I10 Essential (primary) hypertension: Secondary | ICD-10-CM

## 2023-08-16 NOTE — Telephone Encounter (Unsigned)
 Copied from CRM [id number]. Topic: Clinical - Request for Lab/Test Order >> Aug 16, 2023  1:37 PM Randall Kirby wrote: Reason for CRM: Patient would like to have Bloodwork for cholesterol done on 08/26/23

## 2023-08-26 ENCOUNTER — Encounter: Payer: Self-pay | Admitting: Family Medicine

## 2023-08-26 ENCOUNTER — Ambulatory Visit: Admitting: Family Medicine

## 2023-08-26 VITALS — BP 124/70 | HR 58 | Temp 97.6°F | Wt 187.0 lb

## 2023-08-26 DIAGNOSIS — I10 Essential (primary) hypertension: Secondary | ICD-10-CM | POA: Diagnosis not present

## 2023-08-26 DIAGNOSIS — Z0000 Encounter for general adult medical examination without abnormal findings: Secondary | ICD-10-CM | POA: Diagnosis not present

## 2023-08-26 DIAGNOSIS — E782 Mixed hyperlipidemia: Secondary | ICD-10-CM | POA: Diagnosis not present

## 2023-08-26 DIAGNOSIS — Z131 Encounter for screening for diabetes mellitus: Secondary | ICD-10-CM

## 2023-08-26 DIAGNOSIS — I7090 Unspecified atherosclerosis: Secondary | ICD-10-CM

## 2023-08-26 DIAGNOSIS — R351 Nocturia: Secondary | ICD-10-CM

## 2023-08-26 DIAGNOSIS — N401 Enlarged prostate with lower urinary tract symptoms: Secondary | ICD-10-CM | POA: Diagnosis not present

## 2023-08-26 LAB — CBC
HCT: 41.3 % (ref 39.0–52.0)
Hemoglobin: 14 g/dL (ref 13.0–17.0)
MCHC: 34 g/dL (ref 30.0–36.0)
MCV: 86.8 fl (ref 78.0–100.0)
Platelets: 201 K/uL (ref 150.0–400.0)
RBC: 4.76 Mil/uL (ref 4.22–5.81)
RDW: 13.1 % (ref 11.5–15.5)
WBC: 6 K/uL (ref 4.0–10.5)

## 2023-08-26 LAB — COMPREHENSIVE METABOLIC PANEL WITH GFR
ALT: 11 U/L (ref 0–53)
AST: 17 U/L (ref 0–37)
Albumin: 4.6 g/dL (ref 3.5–5.2)
Alkaline Phosphatase: 52 U/L (ref 39–117)
BUN: 17 mg/dL (ref 6–23)
CO2: 28 meq/L (ref 19–32)
Calcium: 9 mg/dL (ref 8.4–10.5)
Chloride: 102 meq/L (ref 96–112)
Creatinine, Ser: 0.87 mg/dL (ref 0.40–1.50)
GFR: 83.75 mL/min (ref >60.00)
Glucose, Bld: 97 mg/dL (ref 70–99)
Potassium: 4 meq/L (ref 3.5–5.1)
Sodium: 139 meq/L (ref 135–145)
Total Bilirubin: 1.1 mg/dL (ref 0.2–1.2)
Total Protein: 7.3 g/dL (ref 6.0–8.3)

## 2023-08-26 LAB — URINALYSIS, ROUTINE W REFLEX MICROSCOPIC
Bilirubin Urine: NEGATIVE
Ketones, ur: NEGATIVE
Leukocytes,Ua: NEGATIVE
Nitrite: NEGATIVE
Specific Gravity, Urine: 1.02 (ref 1.000–1.030)
Total Protein, Urine: NEGATIVE
Urine Glucose: NEGATIVE
Urobilinogen, UA: 0.2 (ref 0.0–1.0)
pH: 6 (ref 5.0–8.0)

## 2023-08-26 LAB — HEMOGLOBIN A1C: Hgb A1c MFr Bld: 6.3 % (ref 4.6–6.5)

## 2023-08-26 LAB — LIPID PANEL
Cholesterol: 119 mg/dL (ref 0–200)
HDL: 36.2 mg/dL — ABNORMAL LOW (ref >39.00)
LDL Cholesterol: 60 mg/dL (ref 0–99)
NonHDL: 83.23
Total CHOL/HDL Ratio: 3
Triglycerides: 116 mg/dL (ref 0.0–149.0)
VLDL: 23.2 mg/dL (ref 0.0–40.0)

## 2023-08-26 LAB — PSA: PSA: 2.5 ng/mL (ref 0.10–4.00)

## 2023-08-26 NOTE — Progress Notes (Signed)
 Established Patient Office Visit   Subjective:  Patient ID: Randall Kirby, male    DOB: [birth year]  Age: 78 y.o. MRN: [id number]  No chief complaint on file.   HPI Encounter Diagnoses  Name Primary?   Healthcare maintenance Yes   Essential hypertension    Mixed hyperlipidemia    Screening for diabetes mellitus    BPH associated with nocturia    Calcified atheromatous plaque    Here for physical and follow-up of above.  He is active physically but constantly doing projects around his house.  At the beach.  Constantly working on old cars.  He is active each day.  Up-to-date on health maintenance.  Experiences nocturia associated with nighttime fluids.  Otherwise urine flow is okay.  Blood pressure controlled with Zestoretic .  History of elevated LDL treated with atorvastatin .   ROS   Current Outpatient Medications:    aspirin 81 MG tablet, Take 81 mg by mouth daily., Disp: , Rfl:    atorvastatin  (LIPITOR) 10 MG tablet, TAKE 1 TABLET (10 MG TOTAL) BY MOUTH EVERY OTHER DAY., Disp: 90 tablet, Rfl: 4   lisinopril -hydrochlorothiazide  (ZESTORETIC ) 20-25 MG tablet, TAKE 1 TABLET BY MOUTH DAILY., Disp: 90 tablet, Rfl: 3   Objective:     BP 124/70 (Cuff Size: Normal)   Pulse (!) 58   Temp 97.6 F (36.4 C) (Temporal)   Wt 187 lb (84.8 kg)   SpO2 98%   BMI 25.36 kg/m    Physical Exam Constitutional:      General: He is not in acute distress.    Appearance: Normal appearance. He is not ill-appearing, toxic-appearing or diaphoretic.  HENT:     Head: Normocephalic and atraumatic.     Right Ear: Tympanic membrane, ear canal and external ear normal.     Left Ear: Tympanic membrane, ear canal and external ear normal.     Mouth/Throat:     Mouth: Mucous membranes are moist.     Pharynx: Oropharynx is clear. No oropharyngeal exudate or posterior oropharyngeal erythema.  Eyes:     General: No scleral icterus.       Right eye: No discharge.        Left eye: No discharge.      Extraocular Movements: Extraocular movements intact.     Conjunctiva/sclera: Conjunctivae normal.     Pupils: Pupils are equal, round, and reactive to light.  Cardiovascular:     Rate and Rhythm: Normal rate and regular rhythm.  Pulmonary:     Effort: Pulmonary effort is normal. No respiratory distress.     Breath sounds: Normal breath sounds. No wheezing or rales.  Abdominal:     General: Bowel sounds are normal.     Tenderness: There is no abdominal tenderness. There is no guarding or rebound.  Musculoskeletal:     Cervical back: No rigidity or tenderness.  Skin:    General: Skin is warm and dry.  Neurological:     Mental Status: He is alert and oriented to person, place, and time.  Psychiatric:        Mood and Affect: Mood normal.        Behavior: Behavior normal.      No results found for any visits on 08/26/23.    The ASCVD Risk score (Arnett DK, et al., 2019) failed to calculate for the following reasons:   The valid total cholesterol range is 130 to 320 mg/dL    Assessment & Plan:   Healthcare maintenance  Essential hypertension -  CBC -     Comprehensive metabolic panel with GFR -     Urinalysis, Routine w reflex microscopic  Mixed hyperlipidemia -     Comprehensive metabolic panel with GFR -     Lipid panel  Screening for diabetes mellitus -     Comprehensive metabolic panel with GFR -     Hemoglobin A1c  BPH associated with nocturia -     PSA  Calcified atheromatous plaque    Return in about 1 year (around 08/25/2024), or if symptoms worsen or fail to improve.    Elsie Sim Lent, MD

## 2023-08-29 ENCOUNTER — Ambulatory Visit: Payer: Self-pay | Admitting: Family Medicine

## 2023-09-06 ENCOUNTER — Telehealth: Payer: Self-pay | Admitting: Family Medicine

## 2023-09-06 NOTE — Telephone Encounter (Signed)
 Copied from CRM #[id number]. Topic: Clinical - Lab/Test Results >> Sep 06, 2023 12:20 PM Randall Kirby wrote: Reason for CRM: Patient would like to get copy of lab work from 08/26/23 mail to house, mainly information on  the A1C

## 2023-09-14 ENCOUNTER — Telehealth: Payer: Self-pay | Admitting: Cardiology

## 2023-09-14 DIAGNOSIS — I7121 Aneurysm of the ascending aorta, without rupture: Secondary | ICD-10-CM

## 2023-09-14 NOTE — Telephone Encounter (Signed)
 Pt spouse called in stating pt was supposed to have a test done this year. She asked if order can be placed. Please advise.

## 2023-09-14 NOTE — Telephone Encounter (Signed)
Message sent to Dr.Jordan for advice. 

## 2023-09-15 NOTE — Telephone Encounter (Signed)
 Spoke to patient chest ct scheduled at [facility] 8/14 Arrive at 2:45 pm.

## 2023-09-22 ENCOUNTER — Ambulatory Visit (HOSPITAL_COMMUNITY)
Admission: RE | Admit: 2023-09-22 | Discharge: 2023-09-22 | Disposition: A | Discharge status: Home / Self Care | Source: Ambulatory Visit | Attending: Cardiology | Admitting: Cardiology

## 2023-09-22 DIAGNOSIS — I7121 Aneurysm of the ascending aorta, without rupture: Secondary | ICD-10-CM | POA: Diagnosis not present

## 2023-09-22 DIAGNOSIS — Z9049 Acquired absence of other specified parts of digestive tract: Secondary | ICD-10-CM | POA: Diagnosis not present

## 2023-09-22 MED ORDER — IOHEXOL 350 MG/ML SOLN
80.0000 mL | Freq: Once | INTRAVENOUS | Status: AC | PRN
  Administered 2023-09-22: 80 mL via INTRAVENOUS

## 2023-09-22 MED ORDER — SODIUM CHLORIDE (PF) 0.9 % IJ SOLN
INTRAMUSCULAR | Status: AC
  Filled 2023-09-22: qty 50

## 2023-09-28 ENCOUNTER — Ambulatory Visit: Payer: Self-pay | Admitting: Cardiology

## 2023-10-05 ENCOUNTER — Telehealth: Payer: Self-pay | Admitting: Cardiology

## 2023-10-05 NOTE — Telephone Encounter (Signed)
 Wife Joya) wants a hard copy of patient's CT Angio test mailed to them at [address]

## 2023-10-17 NOTE — Telephone Encounter (Signed)
 Pt's wife calling to f/u on Hard Copy of CT being sent in mail. Wife requesting a c/b regarding this matter. Please advise

## 2023-10-17 NOTE — Telephone Encounter (Signed)
 Spoke to patient's wife copy of 8/14 chest ct mailed to patient's home.

## 2023-10-21 DIAGNOSIS — H04123 Dry eye syndrome of bilateral lacrimal glands: Secondary | ICD-10-CM | POA: Diagnosis not present

## 2023-10-21 DIAGNOSIS — Z961 Presence of intraocular lens: Secondary | ICD-10-CM | POA: Diagnosis not present

## 2023-11-02 IMAGING — CT CT ANGIO CHEST
2 of 7 series · 12 of 46 positions shown · IV contrast (OMNIPAQUE)
Comparison: Most recent CTA is dated 05/05/2020 and also compared
to studies in 0101 and 7776.

CLINICAL DATA: Follow-up of aneurysmal disease of the thoracic
aorta.

EXAM:
CT ANGIOGRAPHY CHEST WITH CONTRAST
TECHNIQUE: Multidetector CT imaging of the chest was performed using the
standard protocol during bolus administration of intravenous
contrast. Multiplanar CT image reconstructions and MIPs were
obtained to evaluate the vascular anatomy.

[Series 6: axial thins · axial · 0.56mm/px · z∈[+1529,+1812]mm · 9 of 343 slices shown]
[im 30/343  lung]
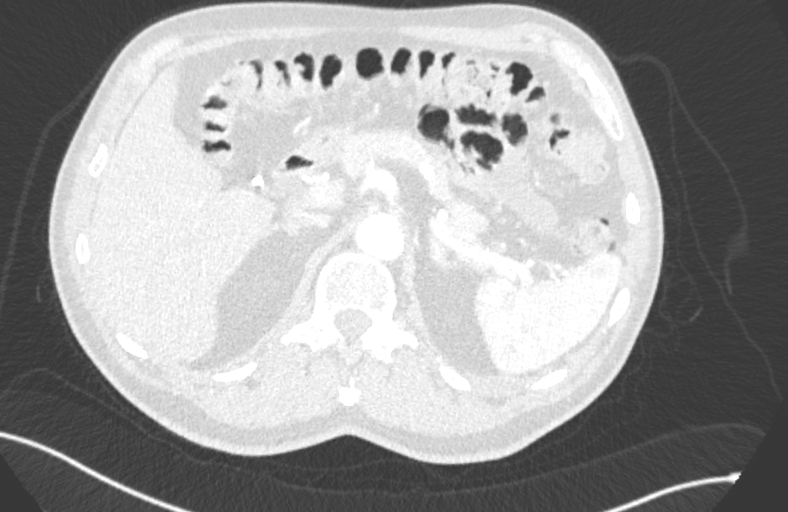
[im 60/343  soft-tissue]
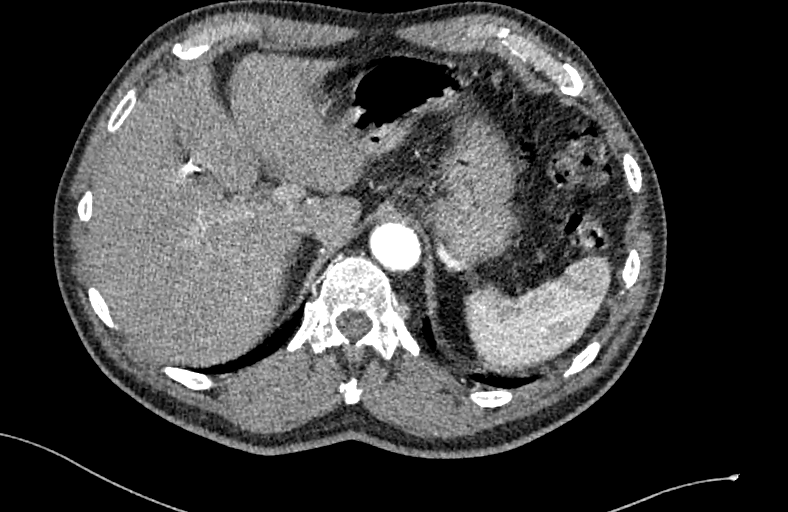
[im 105/343  lung]
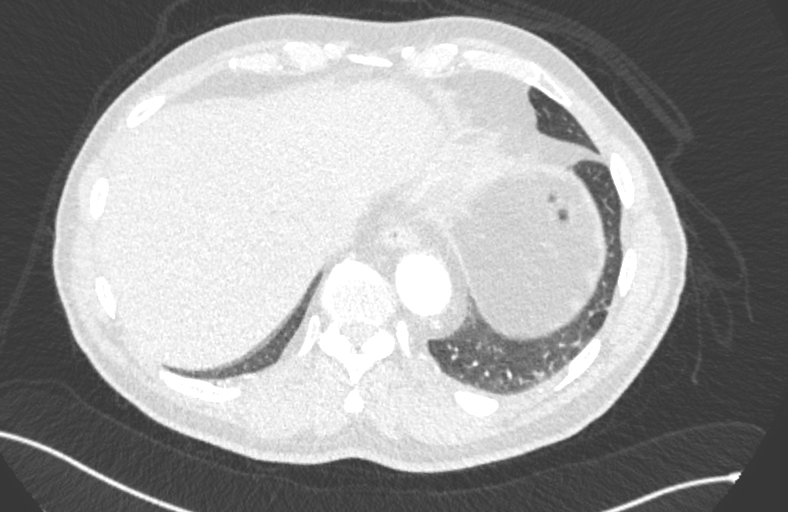
[im 134/343  soft-tissue]
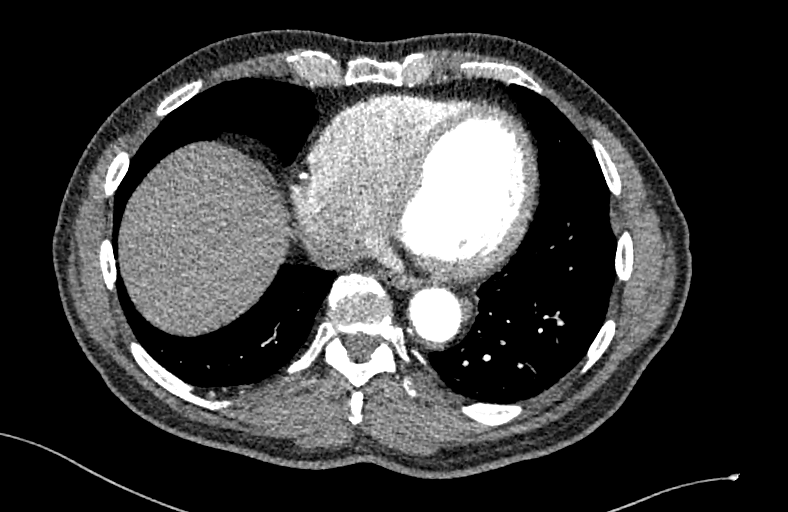
[im 179/343  lung]
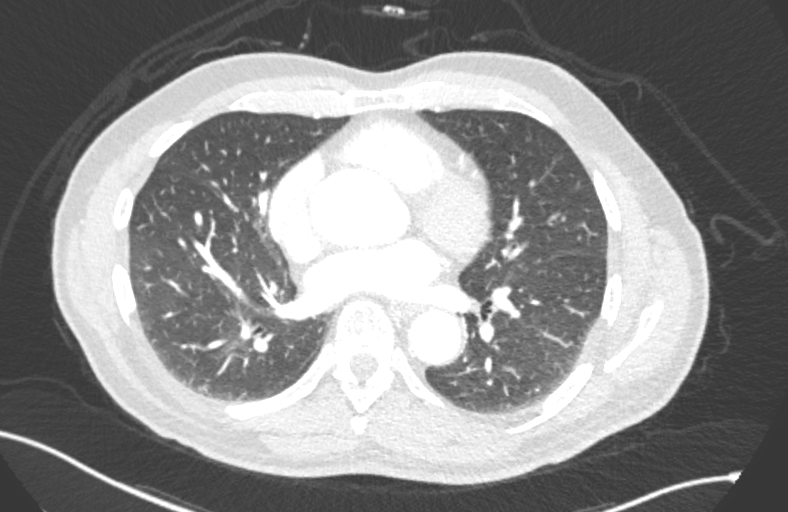
[im 209/343  soft-tissue]
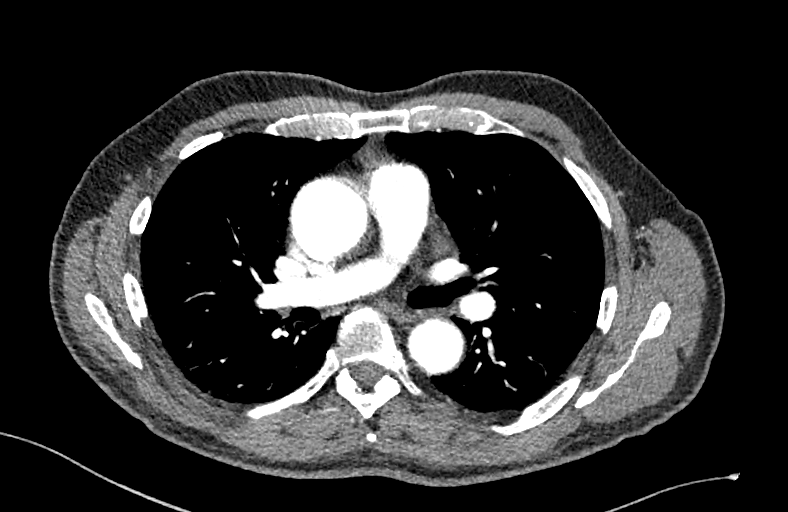
[im 238/343  lung]
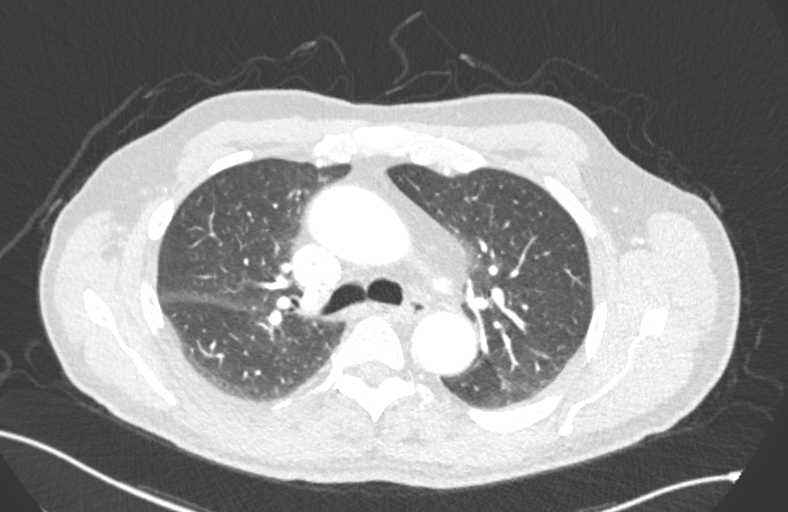
[im 283/343  soft-tissue]
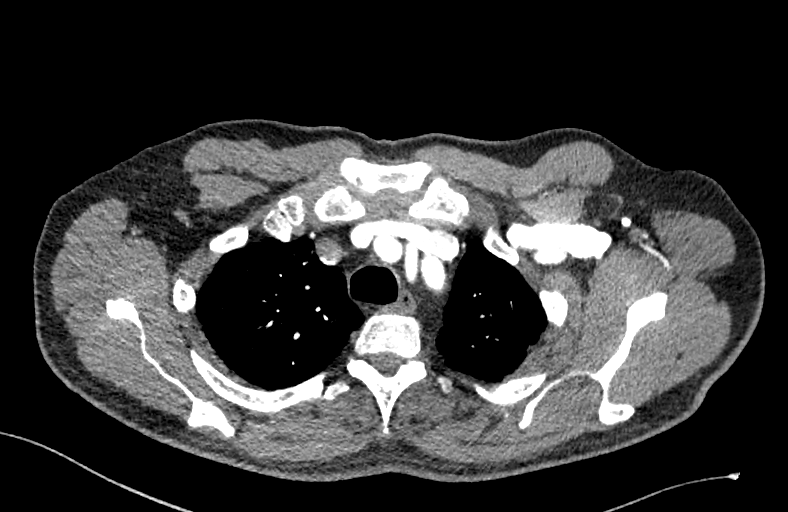
[im 313/343  lung]
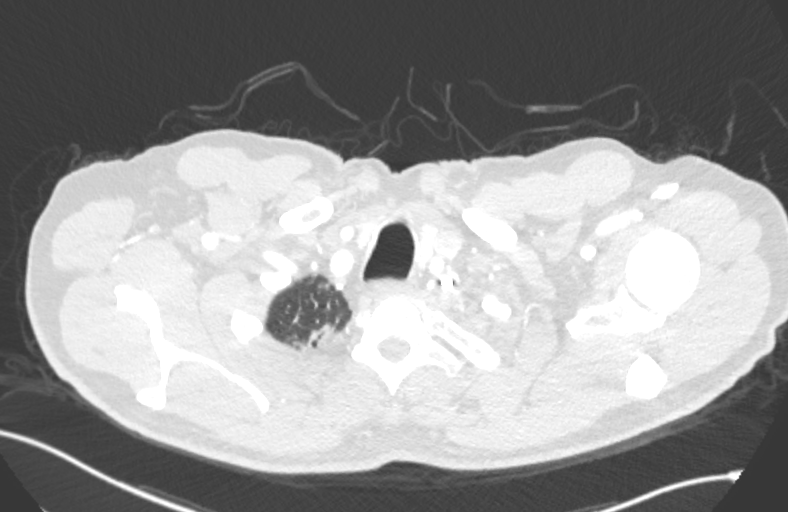

[Series 7: coronal thins · coronal · 0.67mm/px · 3 of 145 slices shown]
[im 29/145  soft-tissue]
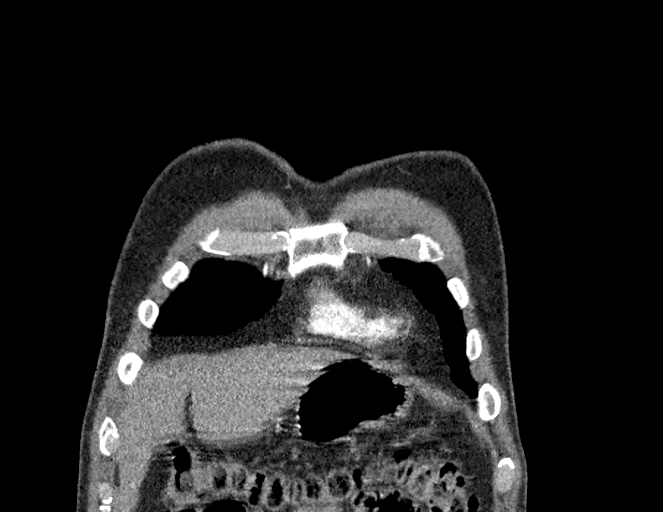
[im 58/145  soft-tissue]
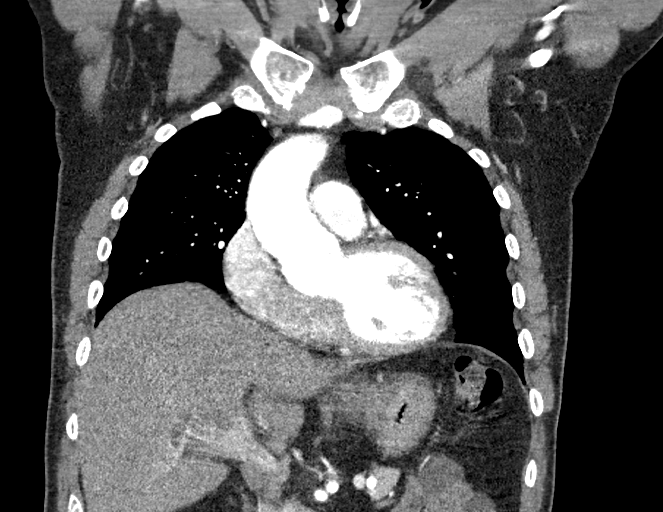
[im 87/145  soft-tissue]
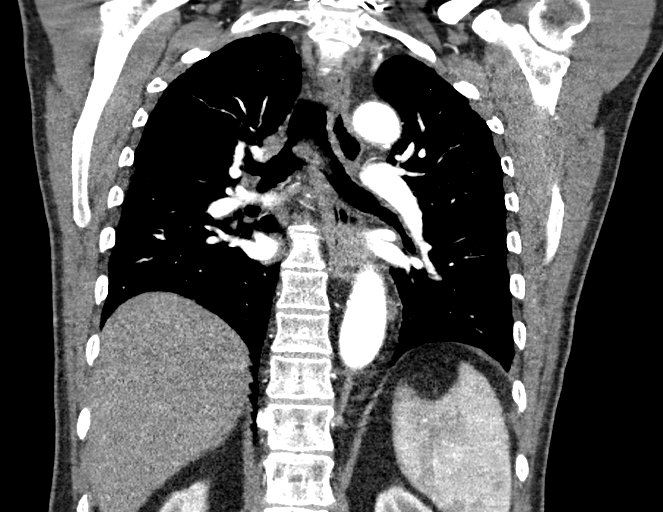

[12 of 46 positions shown; findings below may reference images not displayed]

RADIATION DOSE REDUCTION: This exam was performed according to the
departmental dose-optimization program which includes automated
exposure control, adjustment of the mA and/or kV according to
patient size and/or use of iterative reconstruction technique.

CONTRAST:  100mL OMNIPAQUE IOHEXOL 350 MG/ML SOLN
FINDINGS: Cardiovascular: Aortic root shows stable caliber of approximately 4
cm at the sinuses of Valsalva. The ascending thoracic aorta
demonstrates stable mild dilatation with maximal diameter of
approximately 4.3 cm. The proximal arch measures 3.2 cm and the
distal arch 2.8 cm. Aneurysmal disease of the proximal descending
thoracic aorta now measures 3.5 cm which represents mild gradual
enlargement since 7776 at which time the same area measured
approximately 3.1 cm. The descending aorta tapers to a caliber of
approximately 2.9 cm in the mid and distal segments. No evidence of
aortic dissection or penetrating ulcer. Proximal great vessels
demonstrate stable and normal patency without aneurysm or
significant atherosclerosis.

The heart size is normal. No pericardial fluid identified. Focal
calcified coronary artery plaque again noted in the distribution of
the LAD. Central pulmonary arteries are normal in caliber.

Mediastinum/Nodes: No enlarged mediastinal, hilar, or axillary lymph
nodes. Thyroid gland, trachea, and esophagus demonstrate no
significant findings.

Lungs/Pleura: Stable 4 x 6 mm nodule of the left lower lobe located
near the hilum. This has been stable for several years and is
considered benign. There is no evidence of pulmonary edema,
consolidation, pneumothorax or pleural fluid.

Upper Abdomen: No acute abnormality.

Musculoskeletal: No chest wall abnormality. No acute or significant
osseous findings.

Review of the MIP images confirms the above findings.
IMPRESSION: 1. Stable aneurysmal disease of the ascending thoracic aorta
measuring 4.3 cm in estimated maximal diameter. Recommend annual
imaging followup by CTA or MRA. This recommendation follows 3171
ACCF/AHA/AATS/ACR/ASA/SCA/CHAMPAGNE/NADECIA/DAUD/ARETHA Guidelines for the
Diagnosis and Management of Patients with Thoracic Aortic Disease.
Circulation. 3171; 121: E266-e369. Aortic aneurysm NOS (ZKS7N-V4P.L)
2. Mild aneurysmal disease of the proximal descending thoracic aorta
demonstrates some gradual enlargement since 7776 now measuring
cm in greatest diameter compared to 3.1 cm in 7776. This can also be
followed on an annual basis with the ascending thoracic aorta.
3. Calcified plaque again noted at the level of the LAD.
4. Stable 4 x 6 mm left lower lobe pulmonary nodule is considered
benign.

Aortic aneurysm NOS (ZKS7N-V4P.L).

## 2023-11-28 NOTE — Progress Notes (Signed)
 Cardiology Office Note   Date:  11/28/2023   ID:  Randall Kirby, Randall Kirby Apr 09, 1945, MRN [id number]  PCP:  Berneta Elsie Sayre, MD  Cardiologist:   [name], MD   No chief complaint on file.     History of Present Illness: Randall Kirby is a 78 y.o. male who is seen for follow up CAD and thoracic aneurysm.  He was seen in 2019 for dyspnea. Echo was unremarkable except for thoracic aneurysm. Myoview  was normal. She has a history of HTN and OSA. Last CT in August showed this was stable at 4.7cm. Quit smoking in 1969.   He has good BP control. Has been on lipitor for cholesterol. Is concerned about effects on his A1c. He is very active at home. No dyspnea or chest pain.    Past Medical History:  Diagnosis Date   Cataract    per pt   Hypertension    Sleep apnea     Past Surgical History:  Procedure Laterality Date   CATARACT EXTRACTION, BILATERAL Bilateral    CHOLECYSTECTOMY     COLONOSCOPY  05/31/2013   lumbar HNP     SKIN CANCER EXCISION     nose     Current Outpatient Medications  Medication Sig Dispense Refill   aspirin 81 MG tablet Take 81 mg by mouth daily.     atorvastatin  (LIPITOR) 10 MG tablet TAKE 1 TABLET (10 MG TOTAL) BY MOUTH EVERY OTHER DAY. 90 tablet 4   lisinopril -hydrochlorothiazide  (ZESTORETIC ) 20-25 MG tablet TAKE 1 TABLET BY MOUTH DAILY. 90 tablet 3   No current facility-administered medications for this visit.    Allergies:   Penicillins    Social History:  The patient  reports that he quit smoking about 57 years ago. His smoking use included cigarettes. He started smoking about 60 years ago. He has a 3 pack-year smoking history. He has never used smokeless tobacco. He reports current alcohol use. He reports that he does not use drugs.   Family History:  The patient's family history includes Asthma in an other family member; Heart Problems in his mother; Heart disease in an other family member.    ROS:  Please see the history of  present illness.   Otherwise, review of systems are positive for .   All other systems are reviewed and negative.    PHYSICAL EXAM: VS:  There were no vitals taken for this visit. , BMI There is no height or weight on file to calculate BMI. GEN: Well nourished, well developed, in no acute distress  HEENT: normal  Neck: no JVD, carotid bruits, or masses Cardiac: RRR; no murmurs, rubs, or gallops,no edema  Respiratory:  clear to auscultation bilaterally, normal work of breathing GI: soft, nontender, nondistended, + BS MS: no deformity or atrophy  Skin: warm and dry, no rash Neuro:  Strength and sensation are intact Psych: euthymic mood, full affect       Recent Labs: 08/26/2023: ALT 11; BUN 17; Creatinine, Ser 0.87; Hemoglobin 14.0; Platelets 201.0; Potassium 4.0; Sodium 139    Lipid Panel    Component Value Date/Time   CHOL 119 08/26/2023 1155   TRIG 116.0 08/26/2023 1155   HDL 36.20 (L) 08/26/2023 1155   CHOLHDL 3 08/26/2023 1155   VLDL 23.2 08/26/2023 1155   LDLCALC 60 08/26/2023 1155   LDLDIRECT 94.0 01/06/2021 1124      Wt Readings from Last 3 Encounters:  08/26/23 187 lb (84.8 kg)  03/18/23 184 lb 12.8  oz (83.8 kg)  10/26/22 184 lb (83.5 kg)    EKG Interpretation Date/Time:  Monday December 12 2023 15:39:17 EST Ventricular Rate:  63 PR Interval:  186 QRS Duration:  92 QT Interval:  416 QTC Calculation: 425 R Axis:   -35  Text Interpretation: Normal sinus rhythm Left axis deviation When compared with ECG of 10-Aug-2022 14:53, No significant change was found Confirmed by Persais Ethridge [id number]) on 12/12/2023 3:44:29 PM    Other studies Reviewed: Echo 11/15/17: Study Conclusions   - Left ventricle: The cavity size was mildly dilated. Systolic    function was normal. The estimated ejection fraction was in the    range of 50% to 55%. Wall motion was normal; there were no    regional wall motion abnormalities. There was an increased    relative contribution of  atrial contraction to ventricular    filling. Doppler parameters are consistent with abnormal left    ventricular relaxation (grade 1 diastolic dysfunction).  - Aortic valve: Trileaflet; normal thickness, mildly calcified    leaflets. There was mild regurgitation. Regurgitation pressure    half-time: 545 ms.  - Aorta: Aortic root dimension: 42 mm (ED). Ascending aortic    diameter: 45 mm (S).  - Aortic root: The aortic root was mildly dilated.  - Ascending aorta: The ascending aorta was moderately dilated.  - Mitral valve: Calcified annulus.  - Tricuspid valve: There was trivial regurgitation.  - Pulmonic valve: There was trivial regurgitation.   Myoview  11/12:19: Study Highlights    Nuclear stress EF: 53%. The left ventricular ejection fraction is mildly decreased (45-54%). There was no ST segment deviation noted during stress.   Diaphragmatic attenuation no ischemia Estimated EF 53%   ASSESSMENT AND PLAN:  1.  Thoracic aortic aneurysm-  4.7 cm. Needs yearly CT to follow. Would not consider intervention until > 5.5 cm 2. PACs asymptomatic 3. HTN excellent control  4. OSA. Not currently using CPAP. 5. Coronary artery calcification.  6. HLD on lipitor every other day. LDL 60 is at goal. Discussed effects on A1c and data would still suggest reduction of risk.     Disposition:   FU with me 1 year  Signed, Delanda Bulluck, MD  11/28/2023 2:27 PM    [facility] [address] [place], [place], [address] Phone [phone], Fax [phone]

## 2023-12-12 ENCOUNTER — Ambulatory Visit: Attending: Cardiology | Admitting: Cardiology

## 2023-12-12 ENCOUNTER — Encounter: Payer: Self-pay | Admitting: Cardiology

## 2023-12-12 VITALS — BP 126/50 | HR 63 | Ht 72.0 in | Wt 178.5 lb

## 2023-12-12 DIAGNOSIS — I2510 Atherosclerotic heart disease of native coronary artery without angina pectoris: Secondary | ICD-10-CM | POA: Diagnosis not present

## 2023-12-12 DIAGNOSIS — I10 Essential (primary) hypertension: Secondary | ICD-10-CM | POA: Diagnosis not present

## 2023-12-12 DIAGNOSIS — I7121 Aneurysm of the ascending aorta, without rupture: Secondary | ICD-10-CM | POA: Diagnosis not present

## 2023-12-12 NOTE — Patient Instructions (Addendum)
 Medication Instructions:  Continue same medications *If you need a refill on your cardiac medications before your next appointment, please call your pharmacy*  Lab Work: None ordered  Testing/Procedures: None ordered  Follow-Up: At [facility], you and your health needs are our priority.  As part of our continuing mission to provide you with exceptional heart care, our providers are all part of one team.  This team includes your primary Cardiologist (physician) and Advanced Practice Providers or APPs (Physician Assistants and Nurse Practitioners) who all work together to provide you with the care you need, when you need it.  Your next appointment:  1 year    Call in July to schedule Nov appointment     Provider:  Dr.Jordan   We recommend signing up for the patient portal called MyChart.  Sign up information is provided on this After Visit Summary.  MyChart is used to connect with patients for Virtual Visits (Telemedicine).  Patients are able to view lab/test results, encounter notes, upcoming appointments, etc.  Non-urgent messages can be sent to your provider as well.   To learn more about what you can do with MyChart, go to [email or web address].

## 2023-12-20 DIAGNOSIS — Z23 Encounter for immunization: Secondary | ICD-10-CM | POA: Diagnosis not present

## 2023-12-21 ENCOUNTER — Encounter: Payer: Self-pay | Admitting: Pulmonary Disease

## 2023-12-21 ENCOUNTER — Ambulatory Visit (INDEPENDENT_AMBULATORY_CARE_PROVIDER_SITE_OTHER): Admitting: Pulmonary Disease

## 2023-12-21 VITALS — BP 124/62 | HR 62 | Ht 72.0 in | Wt 178.2 lb

## 2023-12-21 DIAGNOSIS — I10 Essential (primary) hypertension: Secondary | ICD-10-CM

## 2023-12-21 DIAGNOSIS — G4733 Obstructive sleep apnea (adult) (pediatric): Secondary | ICD-10-CM

## 2023-12-21 NOTE — Patient Instructions (Signed)
 DME referral to [employer] for CPAP mask and headgear  Increased pressure settings from CPAP of 5 to CPAP of 6  Give us  a call if you feel the pressure is not tolerated It is a slight bump in the pressure to try and take care of the number of events were seen on your download which unfortunately crept higher compared to the last couple of years that you followed up  Call us  with significant concerns  Will keep the schedule of yearly follow-ups unless you are having any problems

## 2023-12-21 NOTE — Progress Notes (Signed)
 Randall Kirby    [id number]    1945/12/04  Primary Care Physician:Kremer, Elsie Sayre, MD  Referring Physician: Berneta Elsie Sayre, MD [address] [place],  [place] [address]  Chief complaint:   Patient being followed up for obstructive sleep apnea  HPI:  Compliant with CPAP Uses CPAP nightly Tolerates it well Wakes up feeling like his had a good night rest  He has no significant concerns  His machine is at least a couple years old  No chest pain or chest discomfort History of premature atrial contractions  Stays very active  Continues to function well  He has a history of hypertension - Well-controlled Recently started on atorvastatin   Outpatient Encounter Medications as of 12/21/2023  Medication Sig   aspirin 81 MG tablet Take 81 mg by mouth daily.   atorvastatin  (LIPITOR) 10 MG tablet TAKE 1 TABLET (10 MG TOTAL) BY MOUTH EVERY OTHER DAY.   lisinopril -hydrochlorothiazide  (ZESTORETIC ) 20-25 MG tablet TAKE 1 TABLET BY MOUTH DAILY.   No facility-administered encounter medications on file as of 12/21/2023.    Allergies as of 12/21/2023 - Review Complete 12/21/2023  Allergen Reaction Noted   Penicillins Other (See Comments) 10/25/2006    Past Medical History:  Diagnosis Date   Cataract    per pt   Hypertension    Sleep apnea     Past Surgical History:  Procedure Laterality Date   CATARACT EXTRACTION, BILATERAL Bilateral    CHOLECYSTECTOMY     COLONOSCOPY  05/31/2013   lumbar HNP     SKIN CANCER EXCISION     nose    Family History  Problem Relation Age of Onset   Asthma Other    Heart disease Other    Heart Problems Mother    Colon cancer Neg Hx    Colon polyps Neg Hx    Esophageal cancer Neg Hx    Rectal cancer Neg Hx    Stomach cancer Neg Hx     Social History   Socioeconomic History   Marital status: Married    Spouse name: Not on file   Number of children: Not on file   Years of education: Not on file    Highest education level: Not on file  Occupational History   Not on file  Tobacco Use   Smoking status: Former    Current packs/day: 0.00    Average packs/day: 1 pack/day for 3.0 years (3.0 ttl pk-yrs)    Types: Cigarettes    Start date: 02/09/1963    Quit date: 02/08/1966    Years since quitting: 57.9   Smokeless tobacco: Never  Vaping Use   Vaping status: Never Used  Substance and Sexual Activity   Alcohol use: Yes    Comment: ocassional beer/rare 1-2 beers per year per patient/Rm   Drug use: No   Sexual activity: Not on file  Other Topics Concern   Not on file  Social History Narrative   Not on file   Social Drivers of Health   Financial Resource Strain: Low Risk  (09/23/2020)   Overall Financial Resource Strain (CARDIA)    Difficulty of Paying Living Expenses: Not hard at all  Food Insecurity: No Food Insecurity (03/18/2023)   Hunger Vital Sign    Worried About Running Out of Food in the Last Year: Never true    Ran Out of Food in the Last Year: Never true  Transportation Needs: No Transportation Needs (03/18/2023)   PRAPARE -  Administrator, Civil Service (Medical): No    Lack of Transportation (Non-Medical): No  Physical Activity: Inactive (09/19/2019)   Exercise Vital Sign    Days of Exercise per Week: 0 days    Minutes of Exercise per Session: 0 min  Stress: No Stress Concern Present (09/23/2020)   [employer] of Occupational Health - Occupational Stress Questionnaire    Feeling of Stress : Not at all  Social Connections: Moderately Integrated (09/23/2020)   Social Connection and Isolation Panel    Frequency of Communication with Friends and Family: Three times a week    Frequency of Social Gatherings with Friends and Family: Three times a week    Attends Religious Services: 1 to 4 times per year    Active Member of Clubs or Organizations: No    Attends Banker Meetings: Never    Marital Status: Married  Catering Manager Violence: Not  At Risk (09/19/2019)   Humiliation, Afraid, Rape, and Kick questionnaire    Fear of Current or Ex-Partner: No    Emotionally Abused: No    Physically Abused: No    Sexually Abused: No    Review of Systems  Respiratory:  Positive for apnea.   Psychiatric/Behavioral:  Positive for sleep disturbance.     Vitals:   12/21/23 1518  BP: 124/62  Pulse: 62  SpO2: 97%     Physical Exam Constitutional:      Appearance: Normal appearance.  HENT:     Head: Normocephalic.     Mouth/Throat:     Mouth: Mucous membranes are moist.  Eyes:     General: No scleral icterus. Cardiovascular:     Rate and Rhythm: Normal rate and regular rhythm.     Heart sounds: No murmur heard.    No friction rub.  Pulmonary:     Effort: No respiratory distress.     Breath sounds: No stridor. No wheezing or rhonchi.  Musculoskeletal:     Cervical back: No rigidity.  Neurological:     Mental Status: He is alert.  Psychiatric:        Mood and Affect: Mood normal.      Data Reviewed: Sleep study was 04/09/2003 with AHI of 32 Compliance has been relatively good with the last compliance data showing average AHI of 5.5, the previous year, his AHI was 4.2, most recent compliance data reviewed with him showing excellent compliance at the 100% Average use of 8 hours 58 minutes CPAP of 5 Residual AHI is increased to 19.5    Assessment/Plan: Severe obstructive sleep apnea  - Some change in his AHI in the last year - Continues to feel well - Continues to wake up feeling like his had a good nights rest -Does not have any significant changes in his health to account for the worsening of his AHI  He continues to feel well with using his CPAP and plans to continue using it although he does not like the cleaning process of the current machine  Hypertension - Well-controlled  History of premature atrial contractions - Controlled, continues to follow-up with cardiology  We will make some changes to his  machine Will increase the pressure from 5-6 Will also contact the DME company for a new mask and headgear  Will keep follow-ups out a year  Encouraged to give us  a call with any significant concerns    Jennet Epley MD [facility] 12/21/2023, 3:38 PM  CC: Berneta Elsie Sayre,*
